# Patient Record
Sex: Female | Born: 1970
Health system: Southern US, Community
[De-identification: ages and names within clinical notes are randomized; demographics above are authoritative.]

---

## 2017-07-31 DIAGNOSIS — L299 Pruritus, unspecified: Secondary | ICD-10-CM | POA: Insufficient documentation

## 2018-04-14 ENCOUNTER — Ambulatory Visit: Payer: Commercial Managed Care - PPO | Admitting: Family Medicine

## 2018-04-14 ENCOUNTER — Encounter: Payer: Self-pay | Admitting: Family Medicine

## 2018-04-14 VITALS — BP 118/70 | HR 81 | Ht 65.0 in | Wt 130.4 lb

## 2018-04-14 DIAGNOSIS — M791 Myalgia, unspecified site: Secondary | ICD-10-CM | POA: Diagnosis not present

## 2018-04-14 DIAGNOSIS — B349 Viral infection, unspecified: Secondary | ICD-10-CM | POA: Diagnosis not present

## 2018-04-14 DIAGNOSIS — H9202 Otalgia, left ear: Secondary | ICD-10-CM | POA: Diagnosis not present

## 2018-04-14 LAB — CBC
HEMATOCRIT: 38.6 % (ref 36.0–46.0)
HEMOGLOBIN: 12.8 g/dL (ref 12.0–15.0)
MCHC: 33.1 g/dL (ref 30.0–36.0)
MCV: 90.3 fl (ref 78.0–100.0)
PLATELETS: 285 10*3/uL (ref 150.0–400.0)
RBC: 4.27 Mil/uL (ref 3.87–5.11)
RDW: 13.2 % (ref 11.5–15.5)
WBC: 8.9 10*3/uL (ref 4.0–10.5)

## 2018-04-14 LAB — POC INFLUENZA A&B (BINAX/QUICKVUE)
Influenza A, POC: NEGATIVE
Influenza B, POC: NEGATIVE

## 2018-04-14 NOTE — Patient Instructions (Addendum)
Viral Illness, Adult Viruses are tiny germs that can get into a person's body and cause illness. There are many different types of viruses, and they cause many types of illness. Viral illnesses can range from mild to severe. They can affect various parts of the body. Common illnesses that are caused by a virus include colds and the flu. Viral illnesses also include serious conditions such as HIV/AIDS (human immunodeficiency virus/acquired immunodeficiency syndrome). A few viruses have been linked to certain cancers. What are the causes? Many types of viruses can cause illness. Viruses invade cells in your body, multiply, and cause the infected cells to malfunction or die. When the cell dies, it releases more of the virus. When this happens, you develop symptoms of the illness, and the virus continues to spread to other cells. If the virus takes over the function of the cell, it can cause the cell to divide and grow out of control, as is the case when a virus causes cancer. Different viruses get into the body in different ways. You can get a virus by:  Swallowing food or water that is contaminated with the virus.  Breathing in droplets that have been coughed or sneezed into the air by an infected person.  Touching a surface that has been contaminated with the virus and then touching your eyes, nose, or mouth.  Being bitten by an insect or animal that carries the virus.  Having sexual contact with a person who is infected with the virus.  Being exposed to blood or fluids that contain the virus, either through an open cut or during a transfusion.  If a virus enters your body, your body's defense system (immune system) will try to fight the virus. You may be at higher risk for a viral illness if your immune system is weak. What are the signs or symptoms? Symptoms vary depending on the type of virus and the location of the cells that it invades. Common symptoms of the main types of viral illnesses  include: Cold and flu viruses  Fever.  Headache.  Sore throat.  Muscle aches.  Nasal congestion.  Cough. Digestive system (gastrointestinal) viruses  Fever.  Abdominal pain.  Nausea.  Diarrhea. Liver viruses (hepatitis)  Loss of appetite.  Tiredness.  Yellowing of the skin (jaundice). Brain and spinal cord viruses  Fever.  Headache.  Stiff neck.  Nausea and vomiting.  Confusion or sleepiness. Skin viruses  Warts.  Itching.  Rash. Sexually transmitted viruses  Discharge.  Swelling.  Redness.  Rash. How is this treated? Viruses can be difficult to treat because they live within cells. Antibiotic medicines do not treat viruses because these drugs do not get inside cells. Treatment for a viral illness may include:  Resting and drinking plenty of fluids.  Medicines to relieve symptoms. These can include over-the-counter medicine for pain and fever, medicines for cough or congestion, and medicines to relieve diarrhea.  Antiviral medicines. These drugs are available only for certain types of viruses. They may help reduce flu symptoms if taken early. There are also many antiviral medicines for hepatitis and HIV/AIDS.  Some viral illnesses can be prevented with vaccinations. A common example is the flu shot. Follow these instructions at home: Medicines   Take over-the-counter and prescription medicines only as told by your health care provider.  If you were prescribed an antiviral medicine, take it as told by your health care provider. Do not stop taking the medicine even if you start to feel better.  Be aware   of when antibiotics are needed and when they are not needed. Antibiotics do not treat viruses. If your health care provider thinks that you may have a bacterial infection as well as a viral infection, you may get an antibiotic. ? Do not ask for an antibiotic prescription if you have been diagnosed with a viral illness. That will not make your  illness go away faster. ? Frequently taking antibiotics when they are not needed can lead to antibiotic resistance. When this develops, the medicine no longer works against the bacteria that it normally fights. General instructions  Drink enough fluids to keep your urine clear or pale yellow.  Rest as much as possible.  Return to your normal activities as told by your health care provider. Ask your health care provider what activities are safe for you.  Keep all follow-up visits as told by your health care provider. This is important. How is this prevented? Take these actions to reduce your risk of viral infection:  Eat a healthy diet and get enough rest.  Wash your hands often with soap and water. This is especially important when you are in public places. If soap and water are not available, use hand sanitizer.  Avoid close contact with friends and family who have a viral illness.  If you travel to areas where viral gastrointestinal infection is common, avoid drinking water or eating raw food.  Keep your immunizations up to date. Get a flu shot every year as told by your health care provider.  Do not share toothbrushes, nail clippers, razors, or needles with other people.  Always practice safe sex.  Contact a health care provider if:  You have symptoms of a viral illness that do not go away.  Your symptoms come back after going away.  Your symptoms get worse. Get help right away if:  You have trouble breathing.  You have a severe headache or a stiff neck.  You have severe vomiting or abdominal pain. This information is not intended to replace advice given to you by your health care provider. Make sure you discuss any questions you have with your health care provider. Document Released: 04/12/2016 Document Revised: 05/15/2016 Document Reviewed: 04/12/2016 Elsevier Interactive Patient Education  2018 [place], Adult Ticks are insects that  draw blood for food. Most ticks live in shrubs and grassy areas. They climb onto people and animals that brush against the leaves and grasses that they rest on. Then they bite, attaching themselves to the skin. Most ticks are harmless, but some ticks carry germs that can spread to a person through a bite and cause a disease. To reduce your risk of getting a disease from a tick bite, it is important to take steps to prevent tick bites. It is also important to check for ticks after being outdoors. If you find that a tick has attached to you, watch for symptoms of disease. How can I prevent tick bites? Take these steps to help prevent tick bites when you are outdoors in an area where ticks are found:  Use insect repellent that has DEET (20% or higher), picaridin, or IR3535 in it. Use it on: ? Skin that is showing. ? The top of your boots. ? Your pant legs. ? Your sleeve cuffs.  For repellent products that contain permethrin, follow product instructions. Use these products on: ? Clothing. ? Gear. ? Boots. ? Tents.  Wear protective clothing. Long sleeves and long pants offer the best protection from ticks.  Wear light-colored clothing so you can see ticks more easily.  Tuck your pant legs into your socks.  If you go walking on a trail, stay in the middle of the trail so your skin, hair, and clothing do not touch the bushes.  Avoid walking through areas with long grass.  Check for ticks on your clothing, hair, and skin often while you are outside, and check again before you go inside. Make sure to check the places that ticks attach themselves most often. These places include the scalp, neck, armpits, waist, groin, and joint areas. Ticks that carry a disease called Lyme disease have to be attached to the skin for 24-48 hours. Checking for ticks every day will lessen your risk of this and other diseases.  When you come indoors, wash your clothes and take a shower or a bath right away. Dry your  clothes in a dryer on high heat for at least 60 minutes. This will kill any ticks in your clothes.  What is the proper way to remove a tick? If you find a tick on your body, remove it as soon as possible. Removing a tick sooner rather than later can prevent germs from passing from the tick to your body. To remove a tick that is crawling on your skin but has not bitten:  Go outdoors and brush the tick off.  Remove the tick with tape or a lint roller.  To remove a tick that is attached to your skin:  Wash your hands.  If you have latex gloves, put them on.  Use tweezers, curved forceps, or a tick-removal tool to gently grasp the tick as close to your skin and the tick's head as possible.  Gently pull with steady, upward pressure until the tick lets go. When removing the tick: ? Take care to keep the tick's head attached to its body. ? Do not twist or jerk the tick. This can make the tick's head or mouth break off. ? Do not squeeze or crush the tick's body. This could force disease-carrying fluids from the tick into your body.  Do not try to remove a tick with heat, alcohol, petroleum jelly, or fingernail polish. Using these methods can cause the tick to salivate and regurgitate into your bloodstream, increasing your risk of getting a disease. What should I do after removing a tick?  Clean the bite area with soap and water, rubbing alcohol, or an iodine scrub.  If an antiseptic cream or ointment is available, apply a small amount to the bite site.  Wash and disinfect any instruments that you used to remove the tick. How should I dispose of a tick? To dispose of a live tick, use one of these methods:  Place it in rubbing alcohol.  Place it in a sealed bag or container.  Wrap it tightly in tape.  Flush it down the toilet.  Contact a health care provider if:  You have symptoms of a disease after a tick bite. Symptoms of a tick-borne disease can occur from moments after the tick  bites to up to 30 days after a tick is removed. Symptoms include: ? Muscle, joint, or bone pain. ? Difficulty walking or moving your legs. ? Numbness in the legs. ? Paralysis. ? Red rash around the tick bite area that is shaped like a target or a "bull's-eye." ? Redness and swelling in the area of the tick bite. ? Fever. ? Repeated vomiting. ? Diarrhea. ? Weight loss. ? Tender, swollen lymph  glands. ? Shortness of breath. ? Cough. ? Pain in the abdomen. ? Headache. ? Abnormal tiredness. ? A change in your level of consciousness. ? Confusion. Get help right away if:  You are not able to remove a tick.  A part of a tick breaks off and gets stuck in your skin.  Your symptoms get worse. Summary  Ticks may carry germs that can spread to a person through a bite and cause disease.  Wear protective clothing and use insect repellent to prevent tick bites. Follow product instructions.  If you find a tick on your body, remove it as soon as possible. If the tick is attached, do not try to remove with heat, alcohol, petroleum jelly, or fingernail polish.  Remove the attached tick using tweezers, curved forceps, or a tick-removal tool. Gently pull with steady, upward pressure until the tick lets go. Do not twist or jerk the tick. Do not squeeze or crush the tick's body.  If you have symptoms after being bitten by a tick, contact a health care provider. This information is not intended to replace advice given to you by your health care provider. Make sure you discuss any questions you have with your health care provider. Document Released: 11/29/2000 Document Revised: 09/13/2016 Document Reviewed: 09/13/2016 Elsevier Interactive Patient Education  2018 [employer].  Viral Respiratory Infection A respiratory infection is an illness that affects part of the respiratory system, such as the lungs, nose, or throat. Most respiratory infections are caused by either viruses or bacteria. A  respiratory infection that is caused by a virus is called a viral respiratory infection. Common types of viral respiratory infections include:  A cold.  The flu (influenza).  A respiratory syncytial virus (RSV) infection.  How do I know if I have a viral respiratory infection? Most viral respiratory infections cause:  A stuffy or runny nose.  Yellow or green nasal discharge.  A cough.  Sneezing.  Fatigue.  Achy muscles.  A sore throat.  Sweating or chills.  A fever.  A headache.  How are viral respiratory infections treated? If influenza is diagnosed early, it may be treated with an antiviral medicine that shortens the length of time a person has symptoms. Symptoms of viral respiratory infections may be treated with over-the-counter and prescription medicines, such as:  Expectorants. These make it easier to cough up mucus.  Decongestant nasal sprays.  Health care providers do not prescribe antibiotic medicines for viral infections. This is because antibiotics are designed to kill bacteria. They have no effect on viruses. How do I know if I should stay home from work or school? To avoid exposing others to your respiratory infection, stay home if you have:  A fever.  A persistent cough.  A sore throat.  A runny nose.  Sneezing.  Muscles aches.  Headaches.  Fatigue.  Weakness.  Chills.  Sweating.  Nausea.  Follow these instructions at home:  Rest as much as possible.  Take over-the-counter and prescription medicines only as told by your health care provider.  Drink enough fluid to keep your urine clear or pale yellow. This helps prevent dehydration and helps loosen up mucus.  Gargle with a salt-water mixture 3-4 times per day or as needed. To make a salt-water mixture, completely dissolve -1 tsp of salt in 1 cup of warm water.  Use nose drops made from salt water to ease congestion and soften raw skin around your nose.  Do not drink  alcohol.  Do not use  tobacco products, including cigarettes, chewing tobacco, and e-cigarettes. If you need help quitting, ask your health care provider. Contact a health care provider if:  Your symptoms last for 10 days or longer.  Your symptoms get worse over time.  You have a fever.  You have severe sinus pain in your face or forehead.  The glands in your jaw or neck become very swollen. Get help right away if:  You feel pain or pressure in your chest.  You have shortness of breath.  You faint or feel like you will faint.  You have severe and persistent vomiting.  You feel confused or disoriented. This information is not intended to replace advice given to you by your health care provider. Make sure you discuss any questions you have with your health care provider. Document Released: 09/11/2005 Document Revised: 05/09/2016 Document Reviewed: 05/10/2015 Elsevier Interactive Patient Education  [employer].

## 2018-04-14 NOTE — Progress Notes (Signed)
Subjective:  Patient ID: Kayla Bowman, female    DOB: 1971/06/13  Age: 47 y.o. MRN: [id number]  CC: Establish Care   HPI Kayla Bowman presents for evaluation of a 3-day history of mildly elevated temperatures with myalgias, postnasal drip, sore throat and left greater than right ear discomfort.  Patient denies cough or wheezing.  She was seen at urgent care with a negative strep test.  She has had no facial pressure teeth pain and does not grind her teeth.  She is an avid runner and has not been running through the woods lately..  Patient has had a headache as well.  History Kayla Bowman has no past medical history on file.   She has no past surgical history on file.   Her family history is not on file.She reports that she has never smoked. She has never used smokeless tobacco. Her alcohol and drug histories are not on file.  Outpatient Medications Prior to Visit  Medication Sig Dispense Refill  . amoxicillin (AMOXIL) 500 MG capsule Take 500 mg by mouth 2 (two) times daily.     No facility-administered medications prior to visit.     ROS Review of Systems  Constitutional: Negative for chills, fatigue and fever.  HENT: Positive for congestion, ear pain, postnasal drip and sore throat. Negative for hearing loss, rhinorrhea and voice change.   Eyes: Negative.   Respiratory: Negative for cough and wheezing.   Cardiovascular: Negative.   Gastrointestinal: Negative.   Musculoskeletal: Positive for myalgias. Negative for arthralgias.  Skin: Negative for color change and rash.  Neurological: Positive for headaches. Negative for weakness.  Hematological: Does not bruise/bleed easily.  Psychiatric/Behavioral: Negative.     Objective:  BP 118/70   Pulse 81   Ht 5\' 5"  (1.651 m)   Wt 130 lb 6 oz (59.1 kg)   SpO2 98%   BMI 21.70 kg/m   Physical Exam  Constitutional: She is oriented to person, place, and time. She appears well-developed and well-nourished. No distress.  HENT:  Head:  Normocephalic and atraumatic.  Right Ear: External ear normal.  Left Ear: External ear normal.  Nose: Nose normal.  Mouth/Throat: Oropharynx is clear and moist. No oropharyngeal exudate.  Eyes: Pupils are equal, round, and reactive to light. Conjunctivae and EOM are normal. Right eye exhibits no discharge. Left eye exhibits no discharge. No scleral icterus.  Neck: Normal range of motion. No JVD present. No tracheal deviation present. No thyromegaly present.  Cardiovascular: Normal rate, regular rhythm and normal heart sounds.  Pulmonary/Chest: Effort normal. No stridor. No respiratory distress. She has no wheezes. She has no rales.  Abdominal: Bowel sounds are normal.  Neurological: She is alert and oriented to person, place, and time.  Skin: Skin is warm and dry. No rash noted. She is not diaphoretic. No erythema. No pallor.  Psychiatric: She has a normal mood and affect. Her behavior is normal.      Assessment & Plan:   Sreenidhi was seen today for establish care.  Diagnoses and all orders for this visit:  Viral syndrome -     POC Influenza A&B(BINAX/QUICKVUE) -     CBC  Ear discomfort, left  Muscle ache   I am having [name] maintain her amoxicillin.  No orders of the defined types were placed in this encounter.  Ordered prednisone low-dose to treat her stuffy ears.  Patient has not done well with prednisone in the past and her ears are bothering her that much.  Also brought up  the possibility of tic disease with her symptoms.  We will check a CBC to check the platelet level.  Follow-up: Return in about 1 week (around 04/21/2018), or if symptoms worsen or fail to improve.  Libby Maw, MD

## 2018-12-01 ENCOUNTER — Encounter (HOSPITAL_BASED_OUTPATIENT_CLINIC_OR_DEPARTMENT_OTHER): Payer: Self-pay | Admitting: *Deleted

## 2018-12-01 ENCOUNTER — Encounter (HOSPITAL_COMMUNITY)
Admission: EM | Disposition: A | Discharge status: Home / Self Care | Payer: Self-pay | Source: Home / Self Care | Attending: Emergency Medicine

## 2018-12-01 ENCOUNTER — Observation Stay (HOSPITAL_BASED_OUTPATIENT_CLINIC_OR_DEPARTMENT_OTHER)
Admission: EM | Admit: 2018-12-01 | Discharge: 2018-12-02 | Disposition: A | Discharge status: Home / Self Care | Payer: Commercial Managed Care - PPO | Attending: Surgery | Admitting: Surgery

## 2018-12-01 ENCOUNTER — Emergency Department (HOSPITAL_COMMUNITY): Payer: Commercial Managed Care - PPO | Admitting: Anesthesiology

## 2018-12-01 ENCOUNTER — Other Ambulatory Visit: Payer: Self-pay

## 2018-12-01 ENCOUNTER — Emergency Department (HOSPITAL_BASED_OUTPATIENT_CLINIC_OR_DEPARTMENT_OTHER): Payer: Commercial Managed Care - PPO

## 2018-12-01 ENCOUNTER — Encounter: Payer: Self-pay | Admitting: Family Medicine

## 2018-12-01 ENCOUNTER — Ambulatory Visit: Payer: Commercial Managed Care - PPO | Admitting: Family Medicine

## 2018-12-01 VITALS — BP 110/60 | HR 74 | Temp 99.0°F | Ht 65.0 in

## 2018-12-01 DIAGNOSIS — K769 Liver disease, unspecified: Secondary | ICD-10-CM | POA: Diagnosis not present

## 2018-12-01 DIAGNOSIS — Z791 Long term (current) use of non-steroidal anti-inflammatories (NSAID): Secondary | ICD-10-CM | POA: Diagnosis not present

## 2018-12-01 DIAGNOSIS — Z7982 Long term (current) use of aspirin: Secondary | ICD-10-CM | POA: Diagnosis not present

## 2018-12-01 DIAGNOSIS — R1110 Vomiting, unspecified: Secondary | ICD-10-CM | POA: Insufficient documentation

## 2018-12-01 DIAGNOSIS — R112 Nausea with vomiting, unspecified: Secondary | ICD-10-CM | POA: Diagnosis not present

## 2018-12-01 DIAGNOSIS — R1032 Left lower quadrant pain: Secondary | ICD-10-CM

## 2018-12-01 DIAGNOSIS — R1031 Right lower quadrant pain: Secondary | ICD-10-CM | POA: Diagnosis not present

## 2018-12-01 DIAGNOSIS — K3580 Unspecified acute appendicitis: Principal | ICD-10-CM | POA: Diagnosis present

## 2018-12-01 HISTORY — PX: LAPAROSCOPIC APPENDECTOMY: SHX408

## 2018-12-01 LAB — POCT URINALYSIS DIPSTICK
BILIRUBIN UA: NEGATIVE
Glucose, UA: NEGATIVE
KETONES UA: POSITIVE
Leukocytes, UA: NEGATIVE
Nitrite, UA: NEGATIVE
PH UA: 6 (ref 5.0–8.0)
Protein, UA: POSITIVE — AB
RBC UA: NEGATIVE
SPEC GRAV UA: 1.025 (ref 1.010–1.025)
UROBILINOGEN UA: 0.2 U/dL

## 2018-12-01 LAB — CBC WITH DIFFERENTIAL/PLATELET
Abs Immature Granulocytes: 0.07 10*3/uL (ref 0.00–0.07)
Basophils Absolute: 0 10*3/uL (ref 0.0–0.1)
Basophils Relative: 0 %
Eosinophils Absolute: 0 10*3/uL (ref 0.0–0.5)
Eosinophils Relative: 0 %
HCT: 42.2 % (ref 36.0–46.0)
Hemoglobin: 13.5 g/dL (ref 12.0–15.0)
Immature Granulocytes: 0 %
Lymphocytes Relative: 3 %
Lymphs Abs: 0.5 10*3/uL — ABNORMAL LOW (ref 0.7–4.0)
MCH: 29 pg (ref 26.0–34.0)
MCHC: 32 g/dL (ref 30.0–36.0)
MCV: 90.8 fL (ref 80.0–100.0)
Monocytes Absolute: 1 10*3/uL (ref 0.1–1.0)
Monocytes Relative: 6 %
NEUTROS ABS: 16.5 10*3/uL — AB (ref 1.7–7.7)
Neutrophils Relative %: 91 %
Platelets: 286 10*3/uL (ref 150–400)
RBC: 4.65 MIL/uL (ref 3.87–5.11)
RDW: 13.3 % (ref 11.5–15.5)
WBC: 18.1 10*3/uL — ABNORMAL HIGH (ref 4.0–10.5)
nRBC: 0 % (ref 0.0–0.2)

## 2018-12-01 LAB — LIPASE, BLOOD: Lipase: 30 U/L (ref 11–51)

## 2018-12-01 LAB — COMPREHENSIVE METABOLIC PANEL
ALT: 15 U/L (ref 0–44)
AST: 18 U/L (ref 15–41)
Albumin: 4.4 g/dL (ref 3.5–5.0)
Alkaline Phosphatase: 30 U/L — ABNORMAL LOW (ref 38–126)
Anion gap: 8 (ref 5–15)
BUN: 11 mg/dL (ref 6–20)
CO2: 23 mmol/L (ref 22–32)
Calcium: 8.9 mg/dL (ref 8.9–10.3)
Chloride: 108 mmol/L (ref 98–111)
Creatinine, Ser: 0.65 mg/dL (ref 0.44–1.00)
GFR calc non Af Amer: >60 mL/min (ref >60)
Glucose, Bld: 109 mg/dL — ABNORMAL HIGH (ref 70–99)
Potassium: 4.1 mmol/L (ref 3.5–5.1)
Sodium: 139 mmol/L (ref 135–145)
Total Bilirubin: 1.1 mg/dL (ref 0.3–1.2)
Total Protein: 6.8 g/dL (ref 6.5–8.1)

## 2018-12-01 LAB — HCG, SERUM, QUALITATIVE: PREG SERUM: NEGATIVE

## 2018-12-01 LAB — POCT URINE PREGNANCY: PREG TEST UR: NEGATIVE

## 2018-12-01 SURGERY — APPENDECTOMY, LAPAROSCOPIC
Anesthesia: General | Site: Abdomen

## 2018-12-01 MED ORDER — MEPERIDINE HCL 50 MG/ML IJ SOLN: 6.2500 mg | INTRAMUSCULAR | Status: DC | PRN

## 2018-12-01 MED ORDER — BUPIVACAINE-EPINEPHRINE 0.25% -1:200000 IJ SOLN
INTRAMUSCULAR | Status: DC | PRN
  Administered 2018-12-01: 30 mL

## 2018-12-01 MED ORDER — MIDAZOLAM HCL 5 MG/5ML IJ SOLN
INTRAMUSCULAR | Status: DC | PRN
  Administered 2018-12-01: 2 mg via INTRAVENOUS

## 2018-12-01 MED ORDER — SUGAMMADEX SODIUM 200 MG/2ML IV SOLN
INTRAVENOUS | Status: AC
  Filled 2018-12-01: qty 2

## 2018-12-01 MED ORDER — SODIUM CHLORIDE 0.9 % IV SOLN
2.0000 g | Freq: Once | INTRAVENOUS | Status: AC
  Administered 2018-12-01: 2 g via INTRAVENOUS
  Filled 2018-12-01: qty 20

## 2018-12-01 MED ORDER — METOCLOPRAMIDE HCL 5 MG/ML IJ SOLN: 10.0000 mg | Freq: Once | INTRAMUSCULAR | Status: DC | PRN

## 2018-12-01 MED ORDER — LIDOCAINE 2% (20 MG/ML) 5 ML SYRINGE
INTRAMUSCULAR | Status: DC | PRN
  Administered 2018-12-01: 80 mg via INTRAVENOUS

## 2018-12-01 MED ORDER — SUCCINYLCHOLINE CHLORIDE 200 MG/10ML IV SOSY
PREFILLED_SYRINGE | INTRAVENOUS | Status: DC | PRN
  Administered 2018-12-01: 100 mg via INTRAVENOUS

## 2018-12-01 MED ORDER — SUCCINYLCHOLINE CHLORIDE 200 MG/10ML IV SOSY
PREFILLED_SYRINGE | INTRAVENOUS | Status: AC
  Filled 2018-12-01: qty 10

## 2018-12-01 MED ORDER — IOPAMIDOL (ISOVUE-300) INJECTION 61%
100.0000 mL | Freq: Once | INTRAVENOUS | Status: AC | PRN
  Administered 2018-12-01: 100 mL via INTRAVENOUS

## 2018-12-01 MED ORDER — BUPIVACAINE-EPINEPHRINE (PF) 0.25% -1:200000 IJ SOLN
INTRAMUSCULAR | Status: AC
  Filled 2018-12-01: qty 30

## 2018-12-01 MED ORDER — ONDANSETRON HCL 4 MG/2ML IJ SOLN
INTRAMUSCULAR | Status: DC | PRN
  Administered 2018-12-01: 4 mg via INTRAVENOUS

## 2018-12-01 MED ORDER — SUGAMMADEX SODIUM 200 MG/2ML IV SOLN
INTRAVENOUS | Status: DC | PRN
  Administered 2018-12-01: 125 mg via INTRAVENOUS

## 2018-12-01 MED ORDER — METRONIDAZOLE IN NACL 5-0.79 MG/ML-% IV SOLN
500.0000 mg | Freq: Once | INTRAVENOUS | Status: DC
  Filled 2018-12-01: qty 100

## 2018-12-01 MED ORDER — LACTATED RINGERS IV SOLN
INTRAVENOUS | Status: DC
  Administered 2018-12-01 (×2): via INTRAVENOUS

## 2018-12-01 MED ORDER — FENTANYL CITRATE (PF) 100 MCG/2ML IJ SOLN
INTRAMUSCULAR | Status: DC | PRN
  Administered 2018-12-01 (×2): 100 ug via INTRAVENOUS
  Administered 2018-12-01: 50 ug via INTRAVENOUS

## 2018-12-01 MED ORDER — 0.9 % SODIUM CHLORIDE (POUR BTL) OPTIME
TOPICAL | Status: DC | PRN
  Administered 2018-12-01: 1000 mL

## 2018-12-01 MED ORDER — MIDAZOLAM HCL 2 MG/2ML IJ SOLN
INTRAMUSCULAR | Status: AC
  Filled 2018-12-01: qty 2

## 2018-12-01 MED ORDER — PROMETHAZINE HCL 25 MG/ML IJ SOLN
25.0000 mg | Freq: Once | INTRAMUSCULAR | Status: AC
  Administered 2018-12-01: 25 mg via INTRAMUSCULAR

## 2018-12-01 MED ORDER — FENTANYL CITRATE (PF) 250 MCG/5ML IJ SOLN
INTRAMUSCULAR | Status: AC
  Filled 2018-12-01: qty 5

## 2018-12-01 MED ORDER — LIDOCAINE 2% (20 MG/ML) 5 ML SYRINGE
INTRAMUSCULAR | Status: AC
  Filled 2018-12-01: qty 5

## 2018-12-01 MED ORDER — ROCURONIUM BROMIDE 10 MG/ML (PF) SYRINGE
PREFILLED_SYRINGE | INTRAVENOUS | Status: AC
  Filled 2018-12-01: qty 10

## 2018-12-01 MED ORDER — FENTANYL CITRATE (PF) 100 MCG/2ML IJ SOLN: 25.0000 ug | INTRAMUSCULAR | Status: DC | PRN

## 2018-12-01 MED ORDER — LACTATED RINGERS IR SOLN
Status: DC | PRN
  Administered 2018-12-01: 1000 mL

## 2018-12-01 MED ORDER — PROPOFOL 10 MG/ML IV BOLUS
INTRAVENOUS | Status: AC
  Filled 2018-12-01: qty 20

## 2018-12-01 MED ORDER — DEXAMETHASONE SODIUM PHOSPHATE 10 MG/ML IJ SOLN
INTRAMUSCULAR | Status: DC | PRN
  Administered 2018-12-01: 10 mg via INTRAVENOUS

## 2018-12-01 MED ORDER — PROPOFOL 10 MG/ML IV BOLUS
INTRAVENOUS | Status: DC | PRN
  Administered 2018-12-01: 160 mg via INTRAVENOUS

## 2018-12-01 MED ORDER — ROCURONIUM BROMIDE 10 MG/ML (PF) SYRINGE
PREFILLED_SYRINGE | INTRAVENOUS | Status: DC | PRN
  Administered 2018-12-01: 30 mg via INTRAVENOUS

## 2018-12-01 SURGICAL SUPPLY — 34 items
APPLIER CLIP ROT 10 11.4 M/L (STAPLE)
BENZOIN TINCTURE PRP APPL 2/3 (GAUZE/BANDAGES/DRESSINGS) IMPLANT
CABLE HIGH FREQUENCY MONO STRZ (ELECTRODE) ×3 IMPLANT
CHLORAPREP W/TINT 26ML (MISCELLANEOUS) ×3 IMPLANT
CLIP APPLIE ROT 10 11.4 M/L (STAPLE) IMPLANT
CLOSURE WOUND 1/2 X4 (GAUZE/BANDAGES/DRESSINGS)
COVER SURGICAL LIGHT HANDLE (MISCELLANEOUS) ×3 IMPLANT
COVER WAND RF STERILE (DRAPES) IMPLANT
CUTTER FLEX LINEAR 45M (STAPLE) ×3 IMPLANT
DECANTER SPIKE VIAL GLASS SM (MISCELLANEOUS) ×3 IMPLANT
DERMABOND ADVANCED (GAUZE/BANDAGES/DRESSINGS) ×2
DERMABOND ADVANCED .7 DNX12 (GAUZE/BANDAGES/DRESSINGS) ×1 IMPLANT
DRAPE LAPAROSCOPIC ABDOMINAL (DRAPES) ×3 IMPLANT
ELECT REM PT RETURN 15FT ADLT (MISCELLANEOUS) ×3 IMPLANT
ENDOLOOP SUT PDS II  0 18 (SUTURE)
ENDOLOOP SUT PDS II 0 18 (SUTURE) IMPLANT
GLOVE SURG SIGNA 7.5 PF LTX (GLOVE) ×3 IMPLANT
GOWN STRL REUS W/TWL XL LVL3 (GOWN DISPOSABLE) ×6 IMPLANT
KIT BASIN OR (CUSTOM PROCEDURE TRAY) ×3 IMPLANT
POUCH SPECIMEN RETRIEVAL 10MM (ENDOMECHANICALS) IMPLANT
RELOAD 45 VASCULAR/THIN (ENDOMECHANICALS) IMPLANT
RELOAD STAPLE TA45 3.5 REG BLU (ENDOMECHANICALS) ×3 IMPLANT
SCISSORS LAP 5X35 DISP (ENDOMECHANICALS) ×3 IMPLANT
SET IRRIG TUBING LAPAROSCOPIC (IRRIGATION / IRRIGATOR) ×3 IMPLANT
SHEARS HARMONIC ACE PLUS 36CM (ENDOMECHANICALS) ×3 IMPLANT
SLEEVE XCEL OPT CAN 5 100 (ENDOMECHANICALS) ×3 IMPLANT
STRIP CLOSURE SKIN 1/2X4 (GAUZE/BANDAGES/DRESSINGS) IMPLANT
SUT MNCRL AB 4-0 PS2 18 (SUTURE) ×3 IMPLANT
SUT VIC AB 2-0 SH 18 (SUTURE) IMPLANT
TOWEL OR 17X26 10 PK STRL BLUE (TOWEL DISPOSABLE) ×3 IMPLANT
TOWEL OR NON WOVEN STRL DISP B (DISPOSABLE) ×3 IMPLANT
TRAY LAPAROSCOPIC (CUSTOM PROCEDURE TRAY) ×3 IMPLANT
TROCAR BLADELESS OPT 5 100 (ENDOMECHANICALS) ×3 IMPLANT
TROCAR XCEL BLUNT TIP 100MML (ENDOMECHANICALS) ×3 IMPLANT

## 2018-12-01 NOTE — ED Triage Notes (Signed)
Abdominal pain since this am. Vomiting after lunch. She was seen by her MD and given an antinausea injection.

## 2018-12-01 NOTE — ED Notes (Signed)
Will have CT around 1930

## 2018-12-01 NOTE — Anesthesia Procedure Notes (Signed)
Procedure Name: Intubation Date/Time: 12/01/2018 10:33 PM Performed by: Markia Kyer, Clinical cytogeneticist D, CRNA Pre-anesthesia Checklist: Patient identified, Emergency Drugs available, Suction available and Patient being monitored Patient Re-evaluated:Patient Re-evaluated prior to induction Oxygen Delivery Method: Circle system utilized Preoxygenation: Pre-oxygenation with 100% oxygen Induction Type: IV induction, Rapid sequence and Cricoid Pressure applied Laryngoscope Size: Mac and 4 Grade View: Grade I Tube type: Oral Number of attempts: 1 Airway Equipment and Method: Stylet and Oral airway Placement Confirmation: ETT inserted through vocal cords under direct vision,  positive ETCO2 and breath sounds checked- equal and bilateral Secured at: 21 cm Tube secured with: Tape Dental Injury: Teeth and Oropharynx as per pre-operative assessment

## 2018-12-01 NOTE — Transfer of Care (Signed)
Immediate Anesthesia Transfer of Care Note  Patient: Kayla Bowman  Procedure(s) Performed: APPENDECTOMY LAPAROSCOPIC (N/A Abdomen)  Patient Location: PACU  Anesthesia Type:General  Level of Consciousness: awake, alert  and oriented  Airway & Oxygen Therapy: Patient Spontanous Breathing and Patient connected to face mask oxygen  Post-op Assessment: Report given to RN and Post -op Vital signs reviewed and stable  Post vital signs: Reviewed and stable  Last Vitals:  Vitals Value Taken Time  BP    Temp    Pulse 84 12/01/2018 11:33 PM  Resp 12 12/01/2018 11:33 PM  SpO2 100 % 12/01/2018 11:33 PM  Vitals shown include unvalidated device data.  Last Pain:  Vitals:   12/01/18 2042  TempSrc: Oral  PainSc:          Complications: No apparent anesthesia complications

## 2018-12-01 NOTE — Anesthesia Preprocedure Evaluation (Signed)
Anesthesia Evaluation  Patient identified by MRN, date of birth, ID band Patient awake    Reviewed: Allergy & Precautions, NPO status , Patient's Chart, lab work & pertinent test results  Airway Mallampati: II  TM Distance: >3 FB Neck ROM: Full    Dental no notable dental hx.    Pulmonary neg pulmonary ROS,    Pulmonary exam normal breath sounds clear to auscultation       Cardiovascular negative cardio ROS Normal cardiovascular exam Rhythm:Regular Rate:Normal     Neuro/Psych negative neurological ROS  negative psych ROS   GI/Hepatic negative GI ROS, Neg liver ROS,   Endo/Other  negative endocrine ROS  Renal/GU negative Renal ROS  negative genitourinary   Musculoskeletal negative musculoskeletal ROS (+)   Abdominal   Peds negative pediatric ROS (+)  Hematology negative hematology ROS (+)   Anesthesia Other Findings   Reproductive/Obstetrics negative OB ROS                             Anesthesia Physical Anesthesia Plan  ASA: I and emergent  Anesthesia Plan: General   Post-op Pain Management:    Induction: Intravenous, Rapid sequence and Cricoid pressure planned  PONV Risk Score and Plan: 4 or greater and Ondansetron, Dexamethasone, Midazolam and Scopolamine patch - Pre-op  Airway Management Planned: Oral ETT  Additional Equipment:   Intra-op Plan:   Post-operative Plan: Extubation in OR  Informed Consent: I have reviewed the patients History and Physical, chart, labs and discussed the procedure including the risks, benefits and alternatives for the proposed anesthesia with the patient or authorized representative who has indicated his/her understanding and acceptance.   Dental advisory given  Plan Discussed with: CRNA  Anesthesia Plan Comments:         Anesthesia Quick Evaluation

## 2018-12-01 NOTE — Op Note (Signed)
Re:   Kayla Bowman DOB:   03-21-1971 MRN:   [id number]                   FACILITY:  [facility]  DATE OF PROCEDURE: 12/01/2018                              OPERATIVE REPORT  PREOPERATIVE DIAGNOSIS:  Appendicitis  POSTOPERATIVE DIAGNOSIS:  Acute purulent appendicitis.  PROCEDURE:  Laparoscopic appendectomy.  SURGEON:  Fenton Malling. Lucia Gaskins, MD  ASSISTANT:  No first assistant.  ANESTHESIA:  General endotracheal.  Anesthesiologist: Montez Hageman, MD CRNA: Noralyn Pick D, CRNA  ASA:  2E  ESTIMATED BLOOD LOSS:  Minimal.  DRAINS: none   SPECIMEN:   Appendix  COUNTS CORRECT:  YES  INDICATIONS FOR PROCEDURE: Kayla Bowman is a 47 y.o. (DOB: 04-Mar-1971)  female whose primary care doctor is Libby Maw, MD and comes to the OR for an appendectomy.   I discussed with the patient, the indications and potential complications of appendiceal surgery.  The potential complications include, but are not limited to, bleeding, open surgery, bowel resection, and the possibility of another diagnosis.  OPERATIVE NOTE:  The patient underwent a general endotracheal anesthetic as supervised by Anesthesiologist: Montez Hageman, MD CRNA: Marijo Conception, CRNA, General, in [place] room #1.  The patient was given Rocephin and Flagyl prior to the beginning of the procedure and the abdomen was prepped with ChloraPrep.   A time-out was held and surgical checklist run.  An infraumbilical incision was made with sharp dissection carried down to the abdominal cavity.  I used her old transverse umbilical scar.   An 12 mm Hasson trocar was inserted through the infraumbilical incision and into the peritoneal cavity.  A 30 degree 5 mm laparoscope was inserted through a 12 mm Hasson trocar and the Hasson trocar secured with a 0 Vicryl suture.  I placed a 5 mm trocar in the right upper quadrant and a 5 mm torcar in left lower quadrant and did abdominal exploration.    The right and left lobes of liver unremarkable.   She does have at least one benign cyst (about 3 cm) at the edge of the right lobe of the liver.  Stomach was unremarkable.  The pelvic organs were unremarkable, though there was scar tissue that limited the visibility of the ovaries.  I saw no other intra-abdominal abnormality.  The patient had appendicitis with the appendix located at the right pelvic brim.  It was purulent.  There was also some scarring around the appendix I suspect from her prior operations. The appendix was not ruptured.  The mesentery of the appendix was divided with a Harmonic scalpel.  I got to the base of the appendix.  I then used a blue load 45 mm Ethicon Endo-GIA stapler and fired this across the base of the appendix.  I placed the appendix in EndoCatch bag and delivered the bag through the umbilical incision.  I irrigated the abdomen with 600 cc of saline.  After irrigating the abdomen, I then removed the trocars, in turn.  The umbilical port fascia was closed with 0 Vicryl suture.   I closed the skin each site with a 4-0 Monocryl suture and painted the wounds with DermaBond.  I then injected a total of 30 mL of 0.25% Marcaine at the incisions.  Sponge and needle count were correct at the end of the case.  The patient was  transferred to the recovery room in good condition.  The patient tolerated the procedure well and it depends on the patient's post op clinical course as to when the patient could be discharged.   Alphonsa Overall, MD, [facility] Surgery Pager: [phone] Office phone:  [phone]

## 2018-12-01 NOTE — ED Notes (Signed)
Carelink notified Nunzio Cobbs) -  Patient ready to transport to PACU @ [facility]

## 2018-12-01 NOTE — ED Notes (Signed)
Report to carelink. ETA 20 minutes 

## 2018-12-01 NOTE — Progress Notes (Signed)
Established Patient Office Visit  Subjective:  Patient ID: Kayla Bowman, female    DOB: 1971/06/25  Age: 47 y.o. MRN: [id number]  CC:  Chief Complaint  Patient presents with  . Emesis  . Diarrhea    HPI Kayla Bowman presents for treatment and evaluation of a 1 day history of nausea and vomiting x8 associated with upper and lower abdominal pain.  Vomitus without bilious fluid, coffee grinds or frank blood.  There has been an elevated temperature.  Stool was normal this morning.  There is been no diarrhea.  She does have a history of ectopic pregnancy on the right with subsequent salpingo-ectomy.  She is midcycle.  There is been no dysuria frequency urgency or unusual vaginal discharge.  History reviewed. No pertinent past medical history.  History reviewed. No pertinent surgical history.  History reviewed. No pertinent family history.  Social History   Socioeconomic History  . Marital status: Married    Spouse name: Not on file  . Number of children: Not on file  . Years of education: Not on file  . Highest education level: Not on file  Occupational History  . Not on file  Social Needs  . Financial resource strain: Not on file  . Food insecurity:    Worry: Not on file    Inability: Not on file  . Transportation needs:    Medical: Not on file    Non-medical: Not on file  Tobacco Use  . Smoking status: Never Smoker  . Smokeless tobacco: Never Used  Substance and Sexual Activity  . Alcohol use: Not on file  . Drug use: Not on file  . Sexual activity: Not on file  Lifestyle  . Physical activity:    Days per week: Not on file    Minutes per session: Not on file  . Stress: Not on file  Relationships  . Social connections:    Talks on phone: Not on file    Gets together: Not on file    Attends religious service: Not on file    Active member of club or organization: Not on file    Attends meetings of clubs or organizations: Not on file    Relationship status: Not  on file  . Intimate partner violence:    Fear of current or ex partner: Not on file    Emotionally abused: Not on file    Physically abused: Not on file    Forced sexual activity: Not on file  Other Topics Concern  . Not on file  Social History Narrative  . Not on file    Outpatient Medications Prior to Visit  Medication Sig Dispense Refill  . amoxicillin (AMOXIL) 500 MG capsule Take 500 mg by mouth 2 (two) times daily.     No facility-administered medications prior to visit.     Not on File  ROS Review of Systems  Constitutional: Negative for chills, diaphoresis, fatigue, fever and unexpected weight change.  HENT: Negative.   Eyes: Negative for photophobia and visual disturbance.  Respiratory: Negative.   Cardiovascular: Negative.   Gastrointestinal: Positive for abdominal pain, nausea and vomiting. Negative for anal bleeding, blood in stool and constipation.  Endocrine: Negative for polyphagia and polyuria.  Genitourinary: Negative for dysuria, flank pain, frequency, urgency, vaginal bleeding and vaginal discharge.  Musculoskeletal: Negative for arthralgias and myalgias.  Skin: Negative for pallor and rash.  Allergic/Immunologic: Negative for immunocompromised state.  Neurological: Negative for light-headedness and headaches.  Hematological: Does not bruise/bleed easily.  Psychiatric/Behavioral: Negative.       Objective:    Physical Exam  Constitutional: She is oriented to person, place, and time. She appears well-developed and well-nourished. No distress.  HENT:  Head: Normocephalic and atraumatic.  Right Ear: External ear normal.  Left Ear: External ear normal.  Mouth/Throat: Oropharynx is clear and moist. No oropharyngeal exudate.  Eyes: Pupils are equal, round, and reactive to light. Conjunctivae are normal. Right eye exhibits no discharge. Left eye exhibits no discharge. No scleral icterus.  Neck: Neck supple. No JVD present. No tracheal deviation present. No  thyromegaly present.  Cardiovascular: Normal rate, regular rhythm and normal heart sounds.  Pulmonary/Chest: Effort normal and breath sounds normal. No stridor.  Abdominal: Soft. Bowel sounds are normal. She exhibits no distension. There is no hepatosplenomegaly, splenomegaly or hepatomegaly. There is abdominal tenderness in the right lower quadrant. There is rebound. There is no guarding and no CVA tenderness.  Lymphadenopathy:    She has no cervical adenopathy.  Neurological: She is alert and oriented to person, place, and time.  Skin: Skin is warm and dry. She is not diaphoretic.  Psychiatric: She has a normal mood and affect. Her behavior is normal.    BP 110/60   Pulse 74   Temp 99 F (37.2 C) (Oral)   Ht '5\' 5"'  (1.651 m)   SpO2 99%   BMI 21.70 kg/m  Wt Readings from Last 3 Encounters:  04/14/18 130 lb 6 oz (59.1 kg)   BP Readings from Last 3 Encounters:  12/01/18 110/60  04/14/18 118/70   Guideline developer:  UpToDate (see UpToDate for funding source) Date Released: June 2014  Health Maintenance Due  Topic Date Due  . HIV Screening  09/16/1986  . TETANUS/TDAP  09/16/1990  . PAP SMEAR-Modifier  09/16/1992  . INFLUENZA VACCINE  07/16/2018    There are no preventive care reminders to display for this patient.  No results found for: TSH Lab Results  Component Value Date   WBC 8.9 04/14/2018   HGB 12.8 04/14/2018   HCT 38.6 04/14/2018   MCV 90.3 04/14/2018   PLT 285.0 04/14/2018   No results found for: NA, K, CHLORIDE, CO2, GLUCOSE, BUN, CREATININE, BILITOT, ALKPHOS, AST, ALT, PROT, ALBUMIN, CALCIUM, ANIONGAP, EGFR, GFR No results found for: CHOL No results found for: HDL No results found for: LDLCALC No results found for: TRIG No results found for: CHOLHDL No results found for: HGBA1C    Assessment & Plan:   Problem List Items Addressed This Visit    None    Visit Diagnoses    Right lower quadrant abdominal pain    -  Primary   Relevant Orders   POCT  urine pregnancy (Completed)   POCT Urinalysis Dipstick (Completed)   CBC   Comprehensive metabolic panel   Amylase   Left lower quadrant abdominal pain       Relevant Orders   CBC   Comprehensive metabolic panel   Non-intractable vomiting with nausea, unspecified vomiting type       Relevant Medications   promethazine (PHENERGAN) injection 25 mg (Completed)   Other Relevant Orders   CBC   Comprehensive metabolic panel   Amylase      Meds ordered this encounter  Medications  . promethazine (PHENERGAN) injection 25 mg    Follow-up: No follow-ups on file.   Urinalysis showed dehydration urine pregnancy was negative.  Blood work is pending.  Patient with rebound tenderness.  And concerned about appendicitis.  Patient will proceed  to med center in [facility] for consideration of CT scan to rule out appendicitis.

## 2018-12-01 NOTE — H&P (Signed)
Re:   Kayla Bowman DOB:   03-27-1971 MRN:   [id number]  Chief Complaint Abdominal pain  ASSESEMENT AND PLAN: 1.  Appendicitis  I discussed with the patient the indications and risks of appendiceal surgery.  The primary risks of appendiceal surgery include, but are not limited to, bleeding, infection, bowel surgery, and open surgery.  There is also the risk that the patient may have continued symptoms after surgery.  We discussed the typical post-operative recovery course. I tried to answer the patient's questions.  2.  History of prior ectopic pregnancy and multiple laparoscopies related to attempted pregnancies.  Chief Complaint  Patient presents with  . Abdominal Pain  . Fever   PHYSICIAN REQUESTING CONSULTATION:  York Cerise, PA at [facility] ILLNESS: Kayla Bowman is a 47 y.o. (DOB: 1971-08-14)  female whose primary care physician is Libby Maw, MD.   She was transferred from [facility] to the PACU at [facility].    She developed pain in her lower abdomen around 9 AM today.  She had a similar episode in Oct 2019 that lasted about one day, but did not seek medical help.  This time she went to her PCP, Dr. Ethelene Hal, who referred her to the [facility] for a CT scan.  She has no chronic GI issues.  She had a coloncoscopy in [place] in 2017.  She has had an ectopic pregnancy and multiple laparoscopies for attempted pregnancies.  She is otherwise very healthy.  [place] did a CT scan which suggests appendicitis.  Her CT scan on 12/01/2018 - acute appendicitis. WBC - 18,100 - 12/01/2018   History reviewed. No pertinent past medical history.   History reviewed. No pertinent surgical history.    Current Facility-Administered Medications  Medication Dose Route Frequency Provider Last Rate Last Dose  . cefTRIAXone (ROCEPHIN) 2 g in sodium chloride 0.9 % 100 mL IVPB  2 g Intravenous Once Caccavale, Sophia, PA-C 200 mL/hr  at 12/01/18 2038 2 g at 12/01/18 2038   And  . metroNIDAZOLE (FLAGYL) IVPB 500 mg  500 mg Intravenous Once Caccavale, Sophia, PA-C       No current outpatient medications on file.     No Known Allergies  REVIEW OF SYSTEMS: Skin:  No history of rash.  No history of abnormal moles. Infection:  No history of hepatitis or HIV.  No history of MRSA. Neurologic:  No history of stroke.  No history of seizure.  No history of headaches. Cardiac:  No history of hypertension. No history of heart disease.  No history of seeing a cardiologist. Pulmonary:  Does not smoke cigarettes.  No asthma or bronchitis.  No OSA/CPAP.  Endocrine:  No diabetes. No thyroid disease. Gastrointestinal:  See HPI GYN: Multiple laparoscopies and one ectopic pregnancy Urologic:  No history of kidney stones.  No history of bladder infections. Musculoskeletal:  No history of joint or back disease. Hematologic:  No bleeding disorder.  No history of anemia.  Not anticoagulated. Psycho-social:  The patient is oriented.   The patient has no obvious psychologic or social impairment to understanding our conversation and plan.  SOCIAL and FAMILY HISTORY: Married.  Her husband, Kayla Bowman is in [place].  His phone:  [phone]. Her mother is Kayla Bowman.  Lives in [place]. Her phone is [phone]. She has no children.  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  PHYSICAL EXAM: BP (!) 115/59 (BP Location: Right Arm)  Pulse 78   Temp 99 F (37.2 C) (Oral)   Resp 14   Ht 5\' 5"  (1.651 m)   Wt 54.9 kg   LMP 11/13/2018   SpO2 100%   BMI 20.14 kg/m   General: [place] F who is alert and generally healthy appearing.  She said that she feels better after some meds. Skin:  Inspection and palpation - no mass or rash. Eyes:  Conjunctiva and lids unremarkable.            Pupils are equal Ears, Nose, Mouth, and Throat:  Ears and nose unremarkable            Lips and teeth are unremarable. Neck: Supple. No mass,  trachea midline.  No thyroid mass. Lymph Nodes:  No supraclavicular, cervical, or inguinal nodes. Lungs: Normal respiratory effort.  Clear to auscultation and symmetric breath sounds. Heart:  Palpation of the heart is normal.            Auscultation: RRR. No murmur or rub.  She said that she has a murmur, but I did not hear one.  Abdomen: Soft. No mass. Tender with mild guarding in the RLQ.  She has a pfannenstiel scar.            Rectal: Not done. Musculoskeletal:  Good muscle strength and ROM  in upper and lower extremities.  Neurologic:  Grossly intact to motor and sensory function. Psychiatric: Normal judgement and insight. Behavior is normal.            Oriented to time, person, place.   DATA REVIEWED, COUNSELING AND COORDINATION OF CARE: Epic notes reviewed. Counseling and coordination of care exceeded more than 50% of the time spent with patient. Total time spent with patient and charting: 45 minutes  Alphonsa Overall, MD,  [facility] Surgery, [place]  [place], [place]    [place] Phone:  [phone] FAX:  [phone]

## 2018-12-02 ENCOUNTER — Encounter (HOSPITAL_COMMUNITY): Payer: Self-pay | Admitting: Surgery

## 2018-12-02 LAB — COMPREHENSIVE METABOLIC PANEL
ALT: 12 U/L (ref 0–35)
AST: 17 U/L (ref 0–37)
Albumin: 4.4 g/dL (ref 3.5–5.2)
Alkaline Phosphatase: 29 U/L — ABNORMAL LOW (ref 39–117)
BUN: 11 mg/dL (ref 6–23)
CO2: 25 meq/L (ref 19–32)
Calcium: 9 mg/dL (ref 8.4–10.5)
Chloride: 106 mEq/L (ref 96–112)
Creatinine, Ser: 0.68 mg/dL (ref 0.40–1.20)
GFR: 98.49 mL/min (ref >60.00)
GLUCOSE: 114 mg/dL — AB (ref 70–99)
Potassium: 4.2 mEq/L (ref 3.5–5.1)
SODIUM: 139 meq/L (ref 135–145)
Total Bilirubin: 0.8 mg/dL (ref 0.2–1.2)
Total Protein: 6.8 g/dL (ref 6.0–8.3)

## 2018-12-02 LAB — CBC
HEMATOCRIT: 41.6 % (ref 36.0–46.0)
Hemoglobin: 13.5 g/dL (ref 12.0–15.0)
MCHC: 32.5 g/dL (ref 30.0–36.0)
MCV: 89.4 fl (ref 78.0–100.0)
Platelets: 295 10*3/uL (ref 150.0–400.0)
RBC: 4.65 Mil/uL (ref 3.87–5.11)
RDW: 13.5 % (ref 11.5–15.5)
WBC: 17.8 10*3/uL — AB (ref 4.0–10.5)

## 2018-12-02 LAB — AMYLASE: Amylase: 34 U/L (ref 27–131)

## 2018-12-02 MED ORDER — IBUPROFEN 200 MG PO TABS: 400.0000 mg | ORAL_TABLET | Freq: Four times a day (QID) | ORAL | Status: AC | PRN

## 2018-12-02 MED ORDER — TRAMADOL HCL 50 MG PO TABS: 50.0000 mg | ORAL_TABLET | Freq: Four times a day (QID) | ORAL | Status: DC | PRN

## 2018-12-02 MED ORDER — ACETAMINOPHEN 325 MG PO TABS: 650.0000 mg | ORAL_TABLET | Freq: Four times a day (QID) | ORAL | Status: AC | PRN

## 2018-12-02 MED ORDER — ONDANSETRON HCL 4 MG/2ML IJ SOLN: 4.0000 mg | Freq: Four times a day (QID) | INTRAMUSCULAR | Status: DC | PRN

## 2018-12-02 MED ORDER — SODIUM CHLORIDE 0.9 % IV SOLN
2.0000 g | INTRAVENOUS | Status: DC
  Filled 2018-12-02: qty 20

## 2018-12-02 MED ORDER — MORPHINE SULFATE (PF) 2 MG/ML IV SOLN: 1.0000 mg | INTRAVENOUS | Status: DC | PRN

## 2018-12-02 MED ORDER — ONDANSETRON 4 MG PO TBDP: 4.0000 mg | ORAL_TABLET | Freq: Four times a day (QID) | ORAL | Status: DC | PRN

## 2018-12-02 MED ORDER — METRONIDAZOLE IN NACL 5-0.79 MG/ML-% IV SOLN
500.0000 mg | Freq: Three times a day (TID) | INTRAVENOUS | Status: DC
  Administered 2018-12-02 (×2): 500 mg via INTRAVENOUS
  Filled 2018-12-02 (×2): qty 100

## 2018-12-02 MED ORDER — HYDROCODONE-ACETAMINOPHEN 5-325 MG PO TABS: 1.0000 | ORAL_TABLET | ORAL | Status: DC | PRN

## 2018-12-02 MED ORDER — TRAMADOL HCL 50 MG PO TABS: 50.0000 mg | ORAL_TABLET | Freq: Four times a day (QID) | ORAL | 0 refills | Status: DC | PRN

## 2018-12-02 MED ORDER — KCL IN DEXTROSE-NACL 20-5-0.45 MEQ/L-%-% IV SOLN
INTRAVENOUS | Status: DC
  Administered 2018-12-02: 01:00:00 via INTRAVENOUS
  Filled 2018-12-02 (×2): qty 1000

## 2018-12-02 NOTE — Plan of Care (Signed)
Pt is stable, denies any pain or discomfort at this time. Plan of care reviewed .

## 2018-12-02 NOTE — Discharge Summary (Signed)
[facility] Discharge Summary   Patient ID: Kayla Bowman MRN: [id number] DOB/AGE: 02-07-1971 47 y.o.  Admit date: 12/01/2018 Discharge date: 12/02/2018  Admitting Diagnosis: Acute appendicitis  Discharge Diagnosis Acute appendicitis  Consultants None  Imaging: Ct Abdomen Pelvis W Contrast  Result Date: 12/01/2018 CLINICAL DATA:  Generalized abdominal pain, nausea and vomiting for 1 day. EXAM: CT ABDOMEN AND PELVIS WITH CONTRAST TECHNIQUE: Multidetector CT imaging of the abdomen and pelvis was performed using the standard protocol following bolus administration of intravenous contrast. CONTRAST:  124mL ISOVUE-300 IOPAMIDOL (ISOVUE-300) INJECTION 61% COMPARISON:  None. FINDINGS: Lower chest: No significant pulmonary nodules or acute consolidative airspace disease. Hepatobiliary: Normal liver size. Subcentimeter hypodense right liver lobe lesion, too small to characterize, which requires no follow-up unless the patient has risk factors for liver malignancy. No additional liver lesions. Normal gallbladder with no radiopaque cholelithiasis. No biliary ductal dilatation. Pancreas: Normal, with no mass or duct dilation. Spleen: Normal size spleen. Granulomatous splenic calcification. No splenic masses. Adrenals/Urinary Tract: Normal adrenals. Normal kidneys with no hydronephrosis and no renal mass. Normal bladder. Stomach/Bowel: Normal non-distended stomach. Normal caliber small bowel with no small bowel wall thickening. Appendix is dilated (11 mm diameter) and thick walled with appendiceal wall hyperenhancement and periappendiceal fat stranding, compatible with acute appendicitis. Appendix: Location: Right lower quadrant Diameter: 11 mm Appendicolith: Not present Mucosal hyper-enhancement: Present Extraluminal gas: Not present Periappendiceal collection: Not present Normal large bowel with no diverticulosis, large bowel wall thickening or pericolonic fat stranding. Vascular/Lymphatic:  Normal caliber abdominal aorta. Patent portal, splenic, hepatic and renal veins. No pathologically enlarged lymph nodes in the abdomen or pelvis. Reproductive: No adnexal masses. Grossly normal retroverted uterus with a nabothian cyst in the anterior cervix. Other: No pneumoperitoneum, ascites or focal fluid collection. Musculoskeletal: No aggressive appearing focal osseous lesions. IMPRESSION: Acute appendicitis.  No abscess.  No free air. These results were called by telephone at the time of interpretation on 12/01/2018 at 8:13 pm to Dr. Laverta Baltimore , who verbally acknowledged these results. Electronically Signed   By: Ilona Sorrel M.D.   On: 12/01/2018 20:14    Procedures Dr. Lucia Gaskins (12/01/18) - Laparoscopic Appendectomy  Hospital Course:  Patient is a 47 year old female who presented to [facility] with abdominal pain.  Workup showed acute appendicitis.  Patient was admitted and underwent procedure listed above.  Tolerated procedure well and was transferred to the floor.  Diet was advanced as tolerated.  On POD#1, the patient was voiding well, tolerating diet, ambulating well, pain well controlled, vital signs stable, incisions c/d/i and felt stable for discharge home.  Patient will follow up in our office in 2 weeks and knows to call with questions or concerns. She will call to confirm appointment date/time.    Physical Exam: General:  Alert, NAD, pleasant, comfortable Abd:  Soft, ND, mild tenderness, incisions C/D/I   Allergies as of 12/02/2018   No Known Allergies     Medication List    TAKE these medications   acetaminophen 325 MG tablet Commonly known as:  TYLENOL Take 2 tablets (650 mg total) by mouth every 6 (six) hours as needed for mild pain.   aspirin EC 81 MG tablet Take 81 mg by mouth every 4 (four) hours as needed for mild pain.   B-complex with vitamin C tablet Take 1 tablet by mouth daily.   ibuprofen 200 MG tablet Commonly known as:  MOTRIN IB Take 2 tablets (400 mg total) by  mouth every 6 (six) hours as needed  for mild pain.   LUMIFY 0.025 % Soln Generic drug:  Brimonidine Tartrate Apply 1 drop to eye every morning.   SYSTANE 0.4-0.3 % Soln Generic drug:  Polyethyl Glycol-Propyl Glycol Apply 1 drop to eye 2 (two) times daily.   traMADol 50 MG tablet Commonly known as:  ULTRAM Take 1 tablet (50 mg total) by mouth every 6 (six) hours as needed for moderate pain.   vitamin B-12 1000 MCG tablet Commonly known as:  CYANOCOBALAMIN Take 1,000 mcg by mouth daily.   vitamin C 1000 MG tablet Take 1,000 mg by mouth daily.   vitamin E 100 UNIT capsule Take 100 Units by mouth daily.        Follow-up Information    Surgery, [place]. Call.   Specialty:  General Surgery Why:  Call to confirm appointment date/time. Please arrive 30 min prior to appointment time. Bring photo ID and insurance information.  Contact information: [address] [place] [address] [phone]           Signed: Brigid Re, [facility] Surgery 12/02/2018, 9:02 AM Pager: [phone] Consults: [phone] Mon-Fri 7:00 am-4:30 pm Sat-Sun 7:00 am-11:30 am

## 2018-12-02 NOTE — Anesthesia Postprocedure Evaluation (Signed)
Anesthesia Post Note  Patient: Kayla Bowman  Procedure(s) Performed: APPENDECTOMY LAPAROSCOPIC (N/A Abdomen)     Patient location during evaluation: PACU Anesthesia Type: General Level of consciousness: awake and alert Pain management: pain level controlled Vital Signs Assessment: post-procedure vital signs reviewed and stable Respiratory status: spontaneous breathing, nonlabored ventilation, respiratory function stable and patient connected to nasal cannula oxygen Cardiovascular status: blood pressure returned to baseline and stable Postop Assessment: no apparent nausea or vomiting Anesthetic complications: no    Last Vitals:  Vitals:   12/02/18 0000 12/02/18 0015  BP: 94/64 (!) 109/57  Pulse: 63 70  Resp: 13 16  Temp:    SpO2: 100% 100%    Last Pain:  Vitals:   12/02/18 0015  TempSrc:   PainSc: 3                  Montez Hageman

## 2018-12-02 NOTE — ED Provider Notes (Signed)
[facility] Provider Note   CSN: [id number] Arrival date & time: 12/01/18  1636     History   Chief Complaint Chief Complaint  Patient presents with  . Abdominal Pain  . Fever    HPI Kayla Bowman is a 47 y.o. female presenting for evaluation of abdominal pain and fever.  Patient states she developed acute onset abdominal pain around 930 this morning.  She has associated nausea, has thrown up x2.  Patient states she went to her primary care, was found to have a low-grade fever, and it was recommended she come to the ER for further evaluation.  She has not taken anything for her symptoms including Tylenol or ibuprofen.  Nothing makes her pain better or worse.  She denies history of similar pain.  She denies diarrhea or constipation.  She has no medical problems, takes no medications daily.  She denies chest pain, shortness of breath, urinary symptoms.  HPI  History reviewed. No pertinent past medical history.  Patient Active Problem List   Diagnosis Date Noted  . Right lower quadrant abdominal pain 12/01/2018  . Left lower quadrant abdominal pain 12/01/2018  . Non-intractable vomiting 12/01/2018  . Acute appendicitis 12/01/2018  . Viral syndrome 04/14/2018  . Ear discomfort, left 04/14/2018  . Muscle ache 04/14/2018    History reviewed. No pertinent surgical history.   OB History   No obstetric history on file.      Home Medications    Prior to Admission medications   Not on File    Family History History reviewed. No pertinent family history.  Social History Social History   Tobacco Use  . Smoking status: Never Smoker  . Smokeless tobacco: Never Used  Substance Use Topics  . Alcohol use: Not Currently  . Drug use: Never     Allergies   Patient has no known allergies.   Review of Systems Review of Systems  Gastrointestinal: Positive for abdominal pain, nausea and vomiting.  All other systems reviewed and are  negative.    Physical Exam Updated Vital Signs BP 94/64 (BP Location: Right Arm)   Pulse 63   Temp 99.3 F (37.4 C)   Resp 13   Ht 5\' 5"  (1.651 m)   Wt 54.9 kg   LMP 11/13/2018   SpO2 100%   BMI 20.14 kg/m   Physical Exam Vitals signs and nursing note reviewed.  Constitutional:      General: She is not in acute distress.    Appearance: She is well-developed.     Comments: Appears nontoxic  HENT:     Head: Normocephalic and atraumatic.  Eyes:     Conjunctiva/sclera: Conjunctivae normal.     Pupils: Pupils are equal, round, and reactive to light.  Neck:     Musculoskeletal: Normal range of motion and neck supple.  Cardiovascular:     Rate and Rhythm: Normal rate and regular rhythm.  Pulmonary:     Effort: Pulmonary effort is normal. No respiratory distress.     Breath sounds: Normal breath sounds. No wheezing.  Abdominal:     General: There is no distension.     Palpations: Abdomen is soft.     Tenderness: There is abdominal tenderness in the right lower quadrant and left lower quadrant. There is guarding.     Comments: ttp of left and right lower quadrants with voluntary guarding.  Negative rebound.  No masses.  Musculoskeletal: Normal range of motion.  Skin:    General: Skin is  warm and dry.     Capillary Refill: Capillary refill takes less than 2 seconds.  Neurological:     Mental Status: She is alert and oriented to person, place, and time.      ED Treatments / Results  Labs (all labs ordered are listed, but only abnormal results are displayed) Labs Reviewed  CBC WITH DIFFERENTIAL/PLATELET - Abnormal; Notable for the following components:      Result Value   WBC 18.1 (*)    Neutro Abs 16.5 (*)    Lymphs Abs 0.5 (*)    All other components within normal limits  COMPREHENSIVE METABOLIC PANEL - Abnormal; Notable for the following components:   Glucose, Bld 109 (*)    Alkaline Phosphatase 30 (*)    All other components within normal limits  LIPASE, BLOOD   HCG, SERUM, QUALITATIVE  SURGICAL PATHOLOGY    EKG None  Radiology Ct Abdomen Pelvis W Contrast  Result Date: 12/01/2018 CLINICAL DATA:  Generalized abdominal pain, nausea and vomiting for 1 day. EXAM: CT ABDOMEN AND PELVIS WITH CONTRAST TECHNIQUE: Multidetector CT imaging of the abdomen and pelvis was performed using the standard protocol following bolus administration of intravenous contrast. CONTRAST:  11mL ISOVUE-300 IOPAMIDOL (ISOVUE-300) INJECTION 61% COMPARISON:  None. FINDINGS: Lower chest: No significant pulmonary nodules or acute consolidative airspace disease. Hepatobiliary: Normal liver size. Subcentimeter hypodense right liver lobe lesion, too small to characterize, which requires no follow-up unless the patient has risk factors for liver malignancy. No additional liver lesions. Normal gallbladder with no radiopaque cholelithiasis. No biliary ductal dilatation. Pancreas: Normal, with no mass or duct dilation. Spleen: Normal size spleen. Granulomatous splenic calcification. No splenic masses. Adrenals/Urinary Tract: Normal adrenals. Normal kidneys with no hydronephrosis and no renal mass. Normal bladder. Stomach/Bowel: Normal non-distended stomach. Normal caliber small bowel with no small bowel wall thickening. Appendix is dilated (11 mm diameter) and thick walled with appendiceal wall hyperenhancement and periappendiceal fat stranding, compatible with acute appendicitis. Appendix: Location: Right lower quadrant Diameter: 11 mm Appendicolith: Not present Mucosal hyper-enhancement: Present Extraluminal gas: Not present Periappendiceal collection: Not present Normal large bowel with no diverticulosis, large bowel wall thickening or pericolonic fat stranding. Vascular/Lymphatic: Normal caliber abdominal aorta. Patent portal, splenic, hepatic and renal veins. No pathologically enlarged lymph nodes in the abdomen or pelvis. Reproductive: No adnexal masses. Grossly normal retroverted uterus with  a nabothian cyst in the anterior cervix. Other: No pneumoperitoneum, ascites or focal fluid collection. Musculoskeletal: No aggressive appearing focal osseous lesions. IMPRESSION: Acute appendicitis.  No abscess.  No free air. These results were called by telephone at the time of interpretation on 12/01/2018 at 8:13 pm to Dr. Laverta Baltimore , who verbally acknowledged these results. Electronically Signed   By: Ilona Sorrel M.D.   On: 12/01/2018 20:14    Procedures Procedures (including critical care time)  Medications Ordered in ED Medications  cefTRIAXone (ROCEPHIN) 2 g in sodium chloride 0.9 % 100 mL IVPB (2 g Intravenous New Bag/Given 12/01/18 2038)    And  metroNIDAZOLE (FLAGYL) IVPB 500 mg ( Intravenous MAR Hold 12/01/18 2207)  lactated ringers infusion ( Intravenous Anesthesia Volume Adjustment 12/01/18 2334)  fentaNYL (SUBLIMAZE) injection 25-50 mcg (has no administration in time range)  meperidine (DEMEROL) injection 6.25-12.5 mg (has no administration in time range)  metoCLOPramide (REGLAN) injection 10 mg (has no administration in time range)  iopamidol (ISOVUE-300) 61 % injection 100 mL (100 mLs Intravenous Contrast Given 12/01/18 1938)     Initial Impression / Assessment and Plan / ED  Course  I have reviewed the triage vital signs and the nursing notes.  Pertinent labs & imaging results that were available during my care of the patient were reviewed by me and considered in my medical decision making (see chart for details).     Patient presenting for evaluation of abdominal tenderness, nausea, vomiting, and fever.  Physical exam concerning, patient with no abdominal tenderness.  She has associated nausea and vomiting without diarrhea.  Additionally, patient with low-grade fever.  Concern for appendicitis versus diverticulitis versus colitis.  Will obtain labs, CT and reassess.  Labs with elevated white count at 18.  Otherwise, reassuring.  Hemoglobin, electrolytes, kidney, liver, and  pancreatic function reassuring.  CT pending.  CT concerning for acute appendicitis.  Will consult with general surgery.  Discussed with Dr. Lucia Gaskins from general surgery, who recommends transfer to [facility] long for appendectomy.  Antibiotics started.  Patient transported via [place].  Final Clinical Impressions(s) / ED Diagnoses   Final diagnoses:  Acute appendicitis, unspecified acute appendicitis type    ED Discharge Orders    None       Franchot Heidelberg, PA-C 12/02/18 0026    Margette Fast, MD 12/02/18 [phone]

## 2018-12-02 NOTE — Discharge Instructions (Signed)
[facility] LAPAROSCOPIC SURGERY: POST OP INSTRUCTIONS Always review your discharge instruction sheet given to you by the facility where your surgery was performed. IF YOU HAVE DISABILITY OR FAMILY LEAVE FORMS, YOU MUST BRING THEM TO THE OFFICE FOR PROCESSING.   DO NOT GIVE THEM TO YOUR DOCTOR.  PAIN CONTROL  1. First take acetaminophen (Tylenol) AND/or ibuprofen (Advil) to control your pain after surgery.  Follow directions on package.  Taking acetaminophen (Tylenol) and/or ibuprofen (Advil) regularly after surgery will help to control your pain and lower the amount of prescription pain medication you may need.  You should not take more than 3,000 mg (3 grams) of acetaminophen (Tylenol) in 24 hours.  You should not take ibuprofen (Advil), aleve, motrin, naprosyn or other NSAIDS if you have a history of stomach ulcers or chronic kidney disease.  2. A prescription for pain medication may be given to you upon discharge.  Take your pain medication as prescribed, if you still have uncontrolled pain after taking acetaminophen (Tylenol) or ibuprofen (Advil). 3. Use ice packs to help control pain. 4. If you need a refill on your pain medication, please contact your pharmacy.  They will contact our office to request authorization. Prescriptions will not be filled after 5pm or on week-ends.  HOME MEDICATIONS 5. Take your usually prescribed medications unless otherwise directed.  DIET 6. You should follow a light diet the first few days after arrival home.  Be sure to include lots of fluids daily. Avoid fatty, fried foods.   CONSTIPATION 7. It is common to experience some constipation after surgery and if you are taking pain medication.  Increasing fluid intake and taking a stool softener (such as Colace) will usually help or prevent this problem from occurring.  A mild laxative (Milk of Magnesia or Miralax) should be taken according to package instructions if there are no bowel  movements after 48 hours.  WOUND/INCISION CARE 8. Most patients will experience some swelling and bruising in the area of the incisions.  Ice packs will help.  Swelling and bruising can take several days to resolve.  9. Unless discharge instructions indicate otherwise, follow guidelines below  a. STERI-STRIPS - you may remove your outer bandages 48 hours after surgery, and you may shower at that time.  You have steri-strips (small skin tapes) in place directly over the incision.  These strips should be left on the skin for 7-10 days.   b. DERMABOND/SKIN GLUE - you may shower in 24 hours.  The glue will flake off over the next 2-3 weeks. 10. Any sutures or staples will be removed at the office during your follow-up visit.  ACTIVITIES 11. You may resume regular (light) daily activities beginning the next day--such as daily self-care, walking, climbing stairs--gradually increasing activities as tolerated.  You may have sexual intercourse when it is comfortable.  Refrain from any heavy lifting or straining until approved by your doctor. a. You may drive when you are no longer taking prescription pain medication, you can comfortably wear a seatbelt, and you can safely maneuver your car and apply brakes.  FOLLOW-UP 12. You should see your doctor in the office for a follow-up appointment approximately 2-3 weeks after your surgery.  You should have been given your post-op/follow-up appointment when your surgery was scheduled.  If you did not receive a post-op/follow-up appointment, make sure that you call for this appointment within a day or two after you arrive home to insure a convenient appointment time.  WHEN   TO CALL YOUR DOCTOR: 1. Fever over 101.0 2. Inability to urinate 3. Continued bleeding from incision. 4. Increased pain, redness, or drainage from the incision. 5. Increasing abdominal pain  The clinic staff is available to answer your questions during regular business hours.  Please don't  hesitate to call and ask to speak to one of the nurses for clinical concerns.  If you have a medical emergency, go to the nearest emergency room or call 911.  A surgeon from [facility] is always on call at the hospital. [address], [address], [place], [place]  [address] ? [address] [address], [place], [place]   [address] [phone] ? [phone] ? FAX [phone] Web site: [email or web address]  

## 2019-03-24 DIAGNOSIS — R21 Rash and other nonspecific skin eruption: Secondary | ICD-10-CM | POA: Diagnosis not present

## 2019-04-08 DIAGNOSIS — M2560 Stiffness of unspecified joint, not elsewhere classified: Secondary | ICD-10-CM | POA: Diagnosis not present

## 2019-04-08 DIAGNOSIS — M9903 Segmental and somatic dysfunction of lumbar region: Secondary | ICD-10-CM | POA: Diagnosis not present

## 2019-04-15 DIAGNOSIS — M9903 Segmental and somatic dysfunction of lumbar region: Secondary | ICD-10-CM | POA: Diagnosis not present

## 2019-04-15 DIAGNOSIS — M2560 Stiffness of unspecified joint, not elsewhere classified: Secondary | ICD-10-CM | POA: Diagnosis not present

## 2019-04-22 DIAGNOSIS — M2560 Stiffness of unspecified joint, not elsewhere classified: Secondary | ICD-10-CM | POA: Diagnosis not present

## 2019-04-22 DIAGNOSIS — M9903 Segmental and somatic dysfunction of lumbar region: Secondary | ICD-10-CM | POA: Diagnosis not present

## 2019-04-29 DIAGNOSIS — M2560 Stiffness of unspecified joint, not elsewhere classified: Secondary | ICD-10-CM | POA: Diagnosis not present

## 2019-04-29 DIAGNOSIS — M9903 Segmental and somatic dysfunction of lumbar region: Secondary | ICD-10-CM | POA: Diagnosis not present

## 2019-06-28 ENCOUNTER — Ambulatory Visit: Payer: Commercial Managed Care - PPO | Admitting: Family Medicine

## 2019-06-28 ENCOUNTER — Telehealth: Payer: Self-pay

## 2019-06-28 ENCOUNTER — Encounter: Payer: Self-pay | Admitting: Family Medicine

## 2019-06-28 VITALS — BP 118/70 | HR 79 | Ht 65.0 in | Wt 130.5 lb

## 2019-06-28 DIAGNOSIS — R1030 Lower abdominal pain, unspecified: Secondary | ICD-10-CM | POA: Diagnosis not present

## 2019-06-28 LAB — CBC
HCT: 36.3 % (ref 35.0–45.0)
Hemoglobin: 12 g/dL (ref 11.7–15.5)
MCH: 29.6 pg (ref 27.0–33.0)
MCHC: 33.1 g/dL (ref 32.0–36.0)
MCV: 89.4 fL (ref 80.0–100.0)
MPV: 10.1 fL (ref 7.5–12.5)
Platelets: 301 10*3/uL (ref 140–400)
RBC: 4.06 10*6/uL (ref 3.80–5.10)
RDW: 12.5 % (ref 11.0–15.0)
WBC: 7.2 10*3/uL (ref 3.8–10.8)

## 2019-06-28 LAB — POCT URINE PREGNANCY: Preg Test, Ur: NEGATIVE

## 2019-06-28 NOTE — Telephone Encounter (Signed)

## 2019-06-28 NOTE — Progress Notes (Signed)
Established Patient Office Visit  Subjective:  Patient ID: Kayla Bowman, female    DOB: [birth year]  Age: 48 y.o. MRN: [id number]  CC:  Chief Complaint  Patient presents with  . Abdominal Pain    HPI Kayla Bowman presents for treatment and evaluation of a 2-day history of lower abdominal pain with bloating and pressure.  Patient denies fevers chills, dysuria, vaginal discharge, frequency of urination.  Small stool volume this morning but was otherwise normal.  Normally stools 3 times a day.  Recent appendectomy December of this past year.  Status post salpingectomy on the right with preservation of the right ovary some years ago for ectopic pregnancy.  LMP was 6/29 of last month.  She is using no form of contraception.  History reviewed. No pertinent past medical history.  Past Surgical History:  Procedure Laterality Date  . LAPAROSCOPIC APPENDECTOMY N/A 12/01/2018   Procedure: APPENDECTOMY LAPAROSCOPIC;  Surgeon: Alphonsa Overall, MD;  Location: [facility];  Service: General;  Laterality: N/A;    History reviewed. No pertinent family history.  Social History   Socioeconomic History  . Marital status: Married    Spouse name: Not on file  . Number of children: Not on file  . Years of education: Not on file  . Highest education level: Not on file  Occupational History  . Not on file  Social Needs  . Financial resource strain: Not on file  . Food insecurity    Worry: Not on file    Inability: Not on file  . Transportation needs    Medical: Not on file    Non-medical: Not on file  Tobacco Use  . Smoking status: Never Smoker  . Smokeless tobacco: Never Used  Substance and Sexual Activity  . Alcohol use: Not Currently  . Drug use: Never  . Sexual activity: Not on file  Lifestyle  . Physical activity    Days per week: Not on file    Minutes per session: Not on file  . Stress: Not on file  Relationships  . Social Herbalist on phone: Not on file    Gets  together: Not on file    Attends religious service: Not on file    Active member of club or organization: Not on file    Attends meetings of clubs or organizations: Not on file    Relationship status: Not on file  . Intimate partner violence    Fear of current or ex partner: Not on file    Emotionally abused: Not on file    Physically abused: Not on file    Forced sexual activity: Not on file  Other Topics Concern  . Not on file  Social History Narrative  . Not on file    Outpatient Medications Prior to Visit  Medication Sig Dispense Refill  . acetaminophen (TYLENOL) 325 MG tablet Take 2 tablets (650 mg total) by mouth every 6 (six) hours as needed for mild pain.    . Ascorbic Acid (VITAMIN C) 1000 MG tablet Take 1,000 mg by mouth daily.    Marland Kitchen aspirin EC 81 MG tablet Take 81 mg by mouth every 4 (four) hours as needed for mild pain.    . B Complex-C (B-COMPLEX WITH VITAMIN C) tablet Take 1 tablet by mouth daily.    . Brimonidine Tartrate (LUMIFY) 0.025 % SOLN Apply 1 drop to eye every morning.    Marland Kitchen ibuprofen (MOTRIN IB) 200 MG tablet Take 2 tablets (400 mg total)  by mouth every 6 (six) hours as needed for mild pain.    Vladimir Faster Glycol-Propyl Glycol (SYSTANE) 0.4-0.3 % SOLN Apply 1 drop to eye 2 (two) times daily.    . vitamin B-12 (CYANOCOBALAMIN) 1000 MCG tablet Take 1,000 mcg by mouth daily.    . vitamin E 100 UNIT capsule Take 100 Units by mouth daily.    . traMADol (ULTRAM) 50 MG tablet Take 1 tablet (50 mg total) by mouth every 6 (six) hours as needed for moderate pain. 15 tablet 0   No facility-administered medications prior to visit.     No Known Allergies  ROS Review of Systems  Constitutional: Negative for chills, diaphoresis, fatigue, fever and unexpected weight change.  HENT: Negative.   Eyes: Negative for photophobia and visual disturbance.  Respiratory: Negative.   Cardiovascular: Negative.   Gastrointestinal: Positive for abdominal pain. Negative for anal  bleeding, blood in stool, diarrhea, nausea, rectal pain and vomiting.  Endocrine: Negative for polyphagia and polyuria.  Genitourinary: Negative for difficulty urinating, dysuria, frequency, menstrual problem, vaginal bleeding, vaginal discharge and vaginal pain.  Allergic/Immunologic: Negative for immunocompromised state.  Neurological: Negative for headaches.  Hematological: Does not bruise/bleed easily.  Psychiatric/Behavioral: Negative.       Objective:    Physical Exam  Constitutional: She is oriented to person, place, and time. She appears well-developed and well-nourished. No distress.  HENT:  Head: Normocephalic and atraumatic.  Right Ear: External ear normal.  Left Ear: External ear normal.  Mouth/Throat: Oropharynx is clear and moist. No oropharyngeal exudate.  Eyes: Pupils are equal, round, and reactive to light. Conjunctivae are normal. Right eye exhibits no discharge. Left eye exhibits no discharge. No scleral icterus.  Neck: Neck supple. No JVD present. No tracheal deviation present. No thyromegaly present.  Cardiovascular: Normal rate, regular rhythm and normal heart sounds.  Pulmonary/Chest: Effort normal and breath sounds normal. No stridor.  Abdominal: Bowel sounds are normal. She exhibits no distension. There is abdominal tenderness. There is no rebound and no guarding.  Lymphadenopathy:    She has no cervical adenopathy.  Neurological: She is alert and oriented to person, place, and time.  Skin: Skin is warm and dry. She is not diaphoretic.  Psychiatric: She has a normal mood and affect. Her behavior is normal.    BP 118/70   Pulse 79   Ht 5\' 5"  (1.651 m)   Wt 130 lb 8 oz (59.2 kg)   SpO2 94%   BMI 21.72 kg/m  Wt Readings from Last 3 Encounters:  06/28/19 130 lb 8 oz (59.2 kg)  12/01/18 121 lb (54.9 kg)  04/14/18 130 lb 6 oz (59.1 kg)   BP Readings from Last 3 Encounters:  06/28/19 118/70  12/02/18 (!) 96/54  12/01/18 110/60   Guideline developer:   UpToDate (see UpToDate for funding source) Date Released: June 2014  Health Maintenance Due  Topic Date Due  . HIV Screening  09/16/1986  . TETANUS/TDAP  09/16/1990  . PAP SMEAR-Modifier  09/16/1992    There are no preventive care reminders to display for this patient.  No results found for: TSH Lab Results  Component Value Date   WBC 18.1 (H) 12/01/2018   HGB 13.5 12/01/2018   HCT 42.2 12/01/2018   MCV 90.8 12/01/2018   PLT 286 12/01/2018   Lab Results  Component Value Date   NA 139 12/01/2018   K 4.1 12/01/2018   CO2 23 12/01/2018   GLUCOSE 109 (H) 12/01/2018   BUN 11 12/01/2018  CREATININE 0.65 12/01/2018   BILITOT 1.1 12/01/2018   ALKPHOS 30 (L) 12/01/2018   AST 18 12/01/2018   ALT 15 12/01/2018   PROT 6.8 12/01/2018   ALBUMIN 4.4 12/01/2018   CALCIUM 8.9 12/01/2018   ANIONGAP 8 12/01/2018   GFR 98.49 12/01/2018   No results found for: CHOL No results found for: HDL No results found for: LDLCALC No results found for: TRIG No results found for: CHOLHDL No results found for: HGBA1C    Assessment & Plan:   Problem List Items Addressed This Visit    None    Visit Diagnoses    Lower abdominal pain    -  Primary   Relevant Orders   POCT urine pregnancy   Urinalysis, Routine w reflex microscopic   CBC      No orders of the defined types were placed in this encounter.   Follow-up: Return in about 2 days (around 06/30/2019), or if symptoms worsen or fail to improve.

## 2019-06-29 LAB — URINALYSIS, ROUTINE W REFLEX MICROSCOPIC
Bilirubin Urine: NEGATIVE
Hgb urine dipstick: NEGATIVE
Ketones, ur: NEGATIVE
Leukocytes,Ua: NEGATIVE
Nitrite: NEGATIVE
RBC / HPF: NONE SEEN (ref 0–?)
Specific Gravity, Urine: 1.01 (ref 1.000–1.030)
Total Protein, Urine: NEGATIVE
Urine Glucose: NEGATIVE
Urobilinogen, UA: 0.2 (ref 0.0–1.0)
pH: 6 (ref 5.0–8.0)

## 2019-11-12 ENCOUNTER — Other Ambulatory Visit: Payer: Self-pay

## 2019-11-12 ENCOUNTER — Ambulatory Visit (INDEPENDENT_AMBULATORY_CARE_PROVIDER_SITE_OTHER): Payer: Commercial Managed Care - PPO | Admitting: Family Medicine

## 2019-11-12 DIAGNOSIS — R300 Dysuria: Secondary | ICD-10-CM

## 2019-11-12 MED ORDER — CEPHALEXIN 500 MG PO CAPS: 500.0000 mg | ORAL_CAPSULE | Freq: Three times a day (TID) | ORAL | 0 refills | Status: DC

## 2019-11-12 MED ORDER — PHENAZOPYRIDINE HCL 200 MG PO TABS: 200.0000 mg | ORAL_TABLET | Freq: Three times a day (TID) | ORAL | 0 refills | Status: DC | PRN

## 2019-11-12 NOTE — Progress Notes (Signed)
This visit type was conducted due to national recommendations for restrictions regarding the COVID-19 pandemic in an effort to limit this patient's exposure and mitigate transmission in our community.   Virtual Visit via Video Note  I connected with Kayla Bowman on 11/12/19 at 11:00 AM EST by a video enabled telemedicine application and verified that I am speaking with the correct person using two identifiers.  Location patient: home Location provider:work or home office Persons participating in the virtual visit: patient, provider  I discussed the limitations of evaluation and management by telemedicine and the availability of in person appointments. The patient expressed understanding and agreed to proceed.   HPI:  Kayla Bowman relates onset Wednesday morning of some urine frequency and burning with urination.  She had some little bit of blood which she has had previously with acute cystitis.  The symptoms are very similar.  No flank pain.  No nausea or vomiting.  No fever.  She had some leftover amoxicillin and took a couple doses with only minimal improvement.  She states medication was out of date.  Denies any other treatment for recent UTI.  ROS: See pertinent positives and negatives per HPI.  No past medical history on file.  Past Surgical History:  Procedure Laterality Date  . LAPAROSCOPIC APPENDECTOMY N/A 12/01/2018   Procedure: APPENDECTOMY LAPAROSCOPIC;  Surgeon: Alphonsa Overall, MD;  Location: [facility];  Service: General;  Laterality: N/A;    No family history on file.  SOCIAL HX: Non-smoker   Current Outpatient Medications:  .  acetaminophen (TYLENOL) 325 MG tablet, Take 2 tablets (650 mg total) by mouth every 6 (six) hours as needed for mild pain., Disp: , Rfl:  .  Ascorbic Acid (VITAMIN C) 1000 MG tablet, Take 1,000 mg by mouth daily., Disp: , Rfl:  .  aspirin EC 81 MG tablet, Take 81 mg by mouth every 4 (four) hours as needed for mild pain., Disp: , Rfl:  .  B Complex-C  (B-COMPLEX WITH VITAMIN C) tablet, Take 1 tablet by mouth daily., Disp: , Rfl:  .  Brimonidine Tartrate (LUMIFY) 0.025 % SOLN, Apply 1 drop to eye every morning., Disp: , Rfl:  .  cephALEXin (KEFLEX) 500 MG capsule, Take 1 capsule (500 mg total) by mouth 3 (three) times daily., Disp: 15 capsule, Rfl: 0 .  ibuprofen (MOTRIN IB) 200 MG tablet, Take 2 tablets (400 mg total) by mouth every 6 (six) hours as needed for mild pain., Disp: , Rfl:  .  phenazopyridine (PYRIDIUM) 200 MG tablet, Take 1 tablet (200 mg total) by mouth 3 (three) times daily as needed for pain., Disp: 15 tablet, Rfl: 0 .  Polyethyl Glycol-Propyl Glycol (SYSTANE) 0.4-0.3 % SOLN, Apply 1 drop to eye 2 (two) times daily., Disp: , Rfl:  .  vitamin B-12 (CYANOCOBALAMIN) 1000 MCG tablet, Take 1,000 mcg by mouth daily., Disp: , Rfl:  .  vitamin E 100 UNIT capsule, Take 100 Units by mouth daily., Disp: , Rfl:   EXAM:  VITALS per patient if applicable:  GENERAL: alert, oriented, appears well and in no acute distress  HEENT: atraumatic, conjunttiva clear, no obvious abnormalities on inspection of external nose and ears  NECK: normal movements of the head and neck  LUNGS: on inspection no signs of respiratory distress, breathing rate appears normal, no obvious gross SOB, gasping or wheezing  CV: no obvious cyanosis  MS: moves all visible extremities without noticeable abnormality  PSYCH/NEURO: pleasant and cooperative, no obvious depression or anxiety, speech and thought processing  grossly intact  ASSESSMENT AND PLAN:  Discussed the following assessment and plan:  Dysuria.  Suspect uncomplicated cystitis  -Start Keflex 500 mg 3 times daily for 5 days -Patient requesting refill of Pyridium 200 mg every 8 hours as needed for discomfort -Plenty of fluids and follow-up with primary if symptoms not resolved by early next week     I discussed the assessment and treatment plan with the patient. The patient was provided an  opportunity to ask questions and all were answered. The patient agreed with the plan and demonstrated an understanding of the instructions.   The patient was advised to call back or seek an in-person evaluation if the symptoms worsen or if the condition fails to improve as anticipated.   Carolann Littler, MD

## 2019-11-17 ENCOUNTER — Other Ambulatory Visit: Payer: Self-pay

## 2019-11-17 ENCOUNTER — Other Ambulatory Visit: Payer: Commercial Managed Care - PPO

## 2019-11-17 ENCOUNTER — Ambulatory Visit (INDEPENDENT_AMBULATORY_CARE_PROVIDER_SITE_OTHER): Payer: Commercial Managed Care - PPO | Admitting: Family Medicine

## 2019-11-17 ENCOUNTER — Encounter: Payer: Self-pay | Admitting: Family Medicine

## 2019-11-17 DIAGNOSIS — N3001 Acute cystitis with hematuria: Secondary | ICD-10-CM

## 2019-11-17 LAB — URINALYSIS, ROUTINE W REFLEX MICROSCOPIC
Bilirubin Urine: NEGATIVE
Hgb urine dipstick: NEGATIVE
Leukocytes,Ua: NEGATIVE
Nitrite: NEGATIVE
RBC / HPF: NONE SEEN (ref 0–?)
Specific Gravity, Urine: 1.025 (ref 1.000–1.030)
Total Protein, Urine: NEGATIVE
Urine Glucose: NEGATIVE
Urobilinogen, UA: 0.2 (ref 0.0–1.0)
pH: 5.5 (ref 5.0–8.0)

## 2019-11-17 MED ORDER — PHENAZOPYRIDINE HCL 200 MG PO TABS: 200.0000 mg | ORAL_TABLET | Freq: Three times a day (TID) | ORAL | 0 refills | Status: DC | PRN

## 2019-11-17 MED ORDER — CIPROFLOXACIN HCL 500 MG PO TABS: 500.0000 mg | ORAL_TABLET | Freq: Two times a day (BID) | ORAL | 0 refills | Status: AC

## 2019-11-17 NOTE — Progress Notes (Signed)
Established Patient Office Visit  Subjective:  Patient ID: Kayla Bowman, female    DOB: 05-21-1971  Age: 48 y.o. MRN: [id number]  CC:  Chief Complaint  Patient presents with  . Urinary Tract Infection    did video visit last week, given Keflex. Pt is on the last day of it and doesn't feel like she is getting any better.    HPI Kayla Bowman presents for follow-up of her recent UTI that is been treated with Keflex 500 twice daily for 3 days.  Continues with some mild dysuria, frequency and hematuria.  She has had no fevers chills nausea or vomiting.  There is been no abdominal pain.  There is been no unusual vaginal discharge or pruritus.  She is not sexually active.  Her husband has been out of town for an extended period of time.  History reviewed. No pertinent past medical history.  Past Surgical History:  Procedure Laterality Date  . LAPAROSCOPIC APPENDECTOMY N/A 12/01/2018   Procedure: APPENDECTOMY LAPAROSCOPIC;  Surgeon: Alphonsa Overall, MD;  Location: [facility];  Service: General;  Laterality: N/A;    History reviewed. No pertinent family history.  Social History   Socioeconomic History  . Marital status: Married    Spouse name: Not on file  . Number of children: Not on file  . Years of education: Not on file  . Highest education level: Not on file  Occupational History  . Not on file  Social Needs  . Financial resource strain: Not on file  . Food insecurity    Worry: Not on file    Inability: Not on file  . Transportation needs    Medical: Not on file    Non-medical: Not on file  Tobacco Use  . Smoking status: Never Smoker  . Smokeless tobacco: Never Used  Substance and Sexual Activity  . Alcohol use: Not Currently  . Drug use: Never  . Sexual activity: Not on file  Lifestyle  . Physical activity    Days per week: Not on file    Minutes per session: Not on file  . Stress: Not on file  Relationships  . Social Herbalist on phone: Not on file   Gets together: Not on file    Attends religious service: Not on file    Active member of club or organization: Not on file    Attends meetings of clubs or organizations: Not on file    Relationship status: Not on file  . Intimate partner violence    Fear of current or ex partner: Not on file    Emotionally abused: Not on file    Physically abused: Not on file    Forced sexual activity: Not on file  Other Topics Concern  . Not on file  Social History Narrative  . Not on file    Outpatient Medications Prior to Visit  Medication Sig Dispense Refill  . acetaminophen (TYLENOL) 325 MG tablet Take 2 tablets (650 mg total) by mouth every 6 (six) hours as needed for mild pain.    . Ascorbic Acid (VITAMIN C) 1000 MG tablet Take 1,000 mg by mouth daily.    Marland Kitchen aspirin EC 81 MG tablet Take 81 mg by mouth every 4 (four) hours as needed for mild pain.    . B Complex-C (B-COMPLEX WITH VITAMIN C) tablet Take 1 tablet by mouth daily.    . Brimonidine Tartrate (LUMIFY) 0.025 % SOLN Apply 1 drop to eye every morning.    Marland Kitchen  cephALEXin (KEFLEX) 500 MG capsule Take 1 capsule (500 mg total) by mouth 3 (three) times daily. 15 capsule 0  . ibuprofen (MOTRIN IB) 200 MG tablet Take 2 tablets (400 mg total) by mouth every 6 (six) hours as needed for mild pain.    Vladimir Faster Glycol-Propyl Glycol (SYSTANE) 0.4-0.3 % SOLN Apply 1 drop to eye 2 (two) times daily.    . vitamin B-12 (CYANOCOBALAMIN) 1000 MCG tablet Take 1,000 mcg by mouth daily.    . vitamin E 100 UNIT capsule Take 100 Units by mouth daily.    . phenazopyridine (PYRIDIUM) 200 MG tablet Take 1 tablet (200 mg total) by mouth 3 (three) times daily as needed for pain. 15 tablet 0   No facility-administered medications prior to visit.     No Known Allergies  ROS Review of Systems  Constitutional: Negative for diaphoresis, fatigue, fever and unexpected weight change.  Respiratory: Negative.   Cardiovascular: Negative.   Gastrointestinal: Negative for  abdominal pain.  Genitourinary: Positive for dysuria, frequency, hematuria and urgency. Negative for flank pain, vaginal bleeding, vaginal discharge and vaginal pain.  Psychiatric/Behavioral: Negative.       Objective:    Physical Exam  Constitutional: She is oriented to person, place, and time. She appears well-developed and well-nourished. No distress.  HENT:  Head: Normocephalic and atraumatic.  Right Ear: External ear normal.  Left Ear: External ear normal.  Eyes: Conjunctivae are normal. Right eye exhibits no discharge. Left eye exhibits no discharge. No scleral icterus.  Neck: No JVD present. No tracheal deviation present.  Pulmonary/Chest: Effort normal. No stridor.  Neurological: She is alert and oriented to person, place, and time.  Skin: She is not diaphoretic.  Psychiatric: She has a normal mood and affect. Her behavior is normal.    There were no vitals taken for this visit. Wt Readings from Last 3 Encounters:  06/28/19 130 lb 8 oz (59.2 kg)  12/01/18 121 lb (54.9 kg)  04/14/18 130 lb 6 oz (59.1 kg)   BP Readings from Last 3 Encounters:  06/28/19 118/70  12/02/18 (!) 96/54  12/01/18 110/60   Guideline developer:  UpToDate (see UpToDate for funding source) Date Released: June 2014  Health Maintenance Due  Topic Date Due  . HIV Screening  09/16/1986  . TETANUS/TDAP  09/16/1990  . PAP SMEAR-Modifier  09/16/1992  . INFLUENZA VACCINE  07/17/2019    There are no preventive care reminders to display for this patient.  No results found for: TSH Lab Results  Component Value Date   WBC 7.2 06/28/2019   HGB 12.0 06/28/2019   HCT 36.3 06/28/2019   MCV 89.4 06/28/2019   PLT 301 06/28/2019   Lab Results  Component Value Date   NA 139 12/01/2018   K 4.1 12/01/2018   CO2 23 12/01/2018   GLUCOSE 109 (H) 12/01/2018   BUN 11 12/01/2018   CREATININE 0.65 12/01/2018   BILITOT 1.1 12/01/2018   ALKPHOS 30 (L) 12/01/2018   AST 18 12/01/2018   ALT 15 12/01/2018    PROT 6.8 12/01/2018   ALBUMIN 4.4 12/01/2018   CALCIUM 8.9 12/01/2018   ANIONGAP 8 12/01/2018   GFR 98.49 12/01/2018   No results found for: CHOL No results found for: HDL No results found for: LDLCALC No results found for: TRIG No results found for: CHOLHDL No results found for: HGBA1C    Assessment & Plan:   Problem List Items Addressed This Visit    None    Visit Diagnoses  Acute cystitis with hematuria    -  Primary   Relevant Medications   ciprofloxacin (CIPRO) 500 MG tablet   phenazopyridine (PYRIDIUM) 200 MG tablet   Other Relevant Orders   Urinalysis, Routine w reflex microscopic   Urine Culture      Meds ordered this encounter  Medications  . ciprofloxacin (CIPRO) 500 MG tablet    Sig: Take 1 tablet (500 mg total) by mouth 2 (two) times daily for 7 days.    Dispense:  14 tablet    Refill:  0  . phenazopyridine (PYRIDIUM) 200 MG tablet    Sig: Take 1 tablet (200 mg total) by mouth 3 (three) times daily as needed for pain.    Dispense:  10 tablet    Refill:  0    Follow-up: Return in about 1 week (around 11/24/2019), or if symptoms worsen or fail to improve.   Virtual Visit via Video Note  I connected with [name] on 11/17/19 at 10:00 AM EST by a video enabled telemedicine application and verified that I am speaking with the correct person using two identifiers.  Location: Patient: home alone Provider:   I discussed the limitations of evaluation and management by telemedicine and the availability of in person appointments. The patient expressed understanding and agreed to proceed.  History of Present Illness:    Observations/Objective:   Assessment and Plan:   Follow Up Instructions:    I discussed the assessment and treatment plan with the patient. The patient was provided an opportunity to ask questions and all were answered. The patient agreed with the plan and demonstrated an understanding of the instructions.   The patient was  advised to call back or seek an in-person evaluation if the symptoms worsen or if the condition fails to improve as anticipated.  I provided . 20 Minutes of non-face-to-face time during this encounter.   Libby Maw, MD

## 2019-11-18 ENCOUNTER — Other Ambulatory Visit: Payer: Commercial Managed Care - PPO

## 2019-11-19 LAB — URINE CULTURE
MICRO NUMBER:: 1155653
Result:: NO GROWTH
SPECIMEN QUALITY:: ADEQUATE

## 2019-12-29 ENCOUNTER — Other Ambulatory Visit: Payer: Self-pay

## 2019-12-30 ENCOUNTER — Ambulatory Visit: Payer: Commercial Managed Care - PPO | Admitting: Family Medicine

## 2019-12-30 ENCOUNTER — Encounter: Payer: Self-pay | Admitting: Family Medicine

## 2019-12-30 VITALS — BP 102/60 | HR 73 | Temp 97.5°F | Ht 65.0 in | Wt 136.4 lb

## 2019-12-30 DIAGNOSIS — R350 Frequency of micturition: Secondary | ICD-10-CM

## 2019-12-30 DIAGNOSIS — N3281 Overactive bladder: Secondary | ICD-10-CM | POA: Diagnosis not present

## 2019-12-30 DIAGNOSIS — N3289 Other specified disorders of bladder: Secondary | ICD-10-CM | POA: Insufficient documentation

## 2019-12-30 LAB — POCT URINALYSIS DIPSTICK
Bilirubin, UA: NEGATIVE
Blood, UA: NEGATIVE
Glucose, UA: NEGATIVE
Ketones, UA: NEGATIVE
Leukocytes, UA: NEGATIVE
Nitrite, UA: NEGATIVE
Protein, UA: NEGATIVE
Spec Grav, UA: 1.01 (ref 1.010–1.025)
Urobilinogen, UA: 0.2 E.U./dL
pH, UA: 6 (ref 5.0–8.0)

## 2019-12-30 MED ORDER — OXYBUTYNIN CHLORIDE 5 MG PO TABS: 5.0000 mg | ORAL_TABLET | Freq: Three times a day (TID) | ORAL | 0 refills | Status: DC | PRN

## 2019-12-30 NOTE — Progress Notes (Signed)
Established Patient Office Visit  Subjective:  Patient ID: Kayla Bowman, female    DOB: [birth year]  Age: 49 y.o. MRN: [id number]  CC:  Chief Complaint  Patient presents with  . Urinary Tract Infection    pt states that she has urine frequency and urgency to go all the time, no burning or discomfort.    HPI Kayla Bowman presents for evaluation treatment of ongoing urinary frequency with urgency.  She has had no dysuria, incomplete emptying, hematuria or vaginal discharge.  She is not sexually active.  Recent Pap smear back in October was normal.  Was seen for similar symptoms about a month ago with a normal UA and negative culture.  Point-of-care UA today is normal.  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  She is a very busy person and works many hours.  History reviewed. No pertinent past medical history.  Past Surgical History:  Procedure Laterality Date  . LAPAROSCOPIC APPENDECTOMY N/A 12/01/2018   Procedure: APPENDECTOMY LAPAROSCOPIC;  Surgeon: Alphonsa Overall, MD;  Location: [facility];  Service: General;  Laterality: N/A;    History reviewed. No pertinent family history.  Social History   Socioeconomic History  . Marital status: Married    Spouse name: Not on file  . Number of children: Not on file  . Years of education: Not on file  . Highest education level: Not on file  Occupational History  . Not on file  Tobacco Use  . Smoking status: Never Smoker  . Smokeless tobacco: Never Used  Substance and Sexual Activity  . Alcohol use: Not Currently  . Drug use: Never  . Sexual activity: Not on file  Other Topics Concern  . Not on file  Social History Narrative  . Not on file   Social Determinants of Health   Financial Resource Strain:   . Difficulty of Paying Living Expenses: Not on file  Food Insecurity:   . Worried About Charity fundraiser in the Last Year: Not on file  . Ran Out of Food in the Last Year: Not on  file  Transportation Needs:   . Lack of Transportation (Medical): Not on file  . Lack of Transportation (Non-Medical): Not on file  Physical Activity:   . Days of Exercise per Week: Not on file  . Minutes of Exercise per Session: Not on file  Stress:   . Feeling of Stress : Not on file  Social Connections:   . Frequency of Communication with Friends and Family: Not on file  . Frequency of Social Gatherings with Friends and Family: Not on file  . Attends Religious Services: Not on file  . Active Member of Clubs or Organizations: Not on file  . Attends Archivist Meetings: Not on file  . Marital Status: Not on file  Intimate Partner Violence:   . Fear of Current or Ex-Partner: Not on file  . Emotionally Abused: Not on file  . Physically Abused: Not on file  . Sexually Abused: Not on file    Outpatient Medications Prior to Visit  Medication Sig Dispense Refill  . Ascorbic Acid (VITAMIN C) 1000 MG tablet Take 1,000 mg by mouth daily.    . B Complex-C (B-COMPLEX WITH VITAMIN C) tablet Take 1 tablet by mouth daily.    . Brimonidine Tartrate (LUMIFY) 0.025 % SOLN Apply 1 drop to eye every morning.    Vladimir Faster Glycol-Propyl Glycol (SYSTANE) 0.4-0.3 % SOLN Apply  1 drop to eye 2 (two) times daily.    . vitamin B-12 (CYANOCOBALAMIN) 1000 MCG tablet Take 1,000 mcg by mouth daily.    . vitamin E 100 UNIT capsule Take 100 Units by mouth daily.    Marland Kitchen aspirin EC 81 MG tablet Take 81 mg by mouth every 4 (four) hours as needed for mild pain.    . cephALEXin (KEFLEX) 500 MG capsule Take 1 capsule (500 mg total) by mouth 3 (three) times daily. (Patient not taking: Reported on 12/30/2019) 15 capsule 0  . phenazopyridine (PYRIDIUM) 200 MG tablet Take 1 tablet (200 mg total) by mouth 3 (three) times daily as needed for pain. (Patient not taking: Reported on 12/30/2019) 10 tablet 0   No facility-administered medications prior to visit.    No Known Allergies  ROS Review of Systems    Constitutional: Negative.   Respiratory: Negative.   Cardiovascular: Negative.   Gastrointestinal: Negative for abdominal pain, nausea and vomiting.  Endocrine: Negative for polydipsia and polyphagia.  Genitourinary: Positive for frequency and urgency. Negative for difficulty urinating, dysuria and hematuria.  Musculoskeletal: Negative for back pain and myalgias.  Psychiatric/Behavioral: Negative.       Objective:    Physical Exam  Constitutional: She is oriented to person, place, and time. She appears well-developed and well-nourished. No distress.  HENT:  Head: Normocephalic and atraumatic.  Right Ear: External ear normal.  Left Ear: External ear normal.  Eyes: Conjunctivae are normal. Right eye exhibits no discharge. Left eye exhibits no discharge. No scleral icterus.  Neck: No JVD present. No tracheal deviation present.  Cardiovascular: Normal rate, regular rhythm and normal heart sounds.  Pulmonary/Chest: Effort normal and breath sounds normal. No stridor.  Abdominal: Bowel sounds are normal. She exhibits no distension. There is no abdominal tenderness. There is no rebound and no CVA tenderness.  Neurological: She is alert and oriented to person, place, and time.  Skin: She is not diaphoretic.  Psychiatric: She has a normal mood and affect. Her behavior is normal.    BP 102/60   Pulse 73   Temp (!) 97.5 F (36.4 C) (Tympanic)   Ht 5\' 5"  (1.651 m)   Wt 136 lb 6.4 oz (61.9 kg)   SpO2 100%   BMI 22.70 kg/m  Wt Readings from Last 3 Encounters:  12/30/19 136 lb 6.4 oz (61.9 kg)  06/28/19 130 lb 8 oz (59.2 kg)  12/01/18 121 lb (54.9 kg)     Health Maintenance Due  Topic Date Due  . HIV Screening  09/16/1986  . TETANUS/TDAP  09/16/1990  . PAP SMEAR-Modifier  09/16/1992  . INFLUENZA VACCINE  07/17/2019    There are no preventive care reminders to display for this patient.  No results found for: TSH Lab Results  Component Value Date   WBC 7.2 06/28/2019   HGB  12.0 06/28/2019   HCT 36.3 06/28/2019   MCV 89.4 06/28/2019   PLT 301 06/28/2019   Lab Results  Component Value Date   NA 139 12/01/2018   K 4.1 12/01/2018   CO2 23 12/01/2018   GLUCOSE 109 (H) 12/01/2018   BUN 11 12/01/2018   CREATININE 0.65 12/01/2018   BILITOT 1.1 12/01/2018   ALKPHOS 30 (L) 12/01/2018   AST 18 12/01/2018   ALT 15 12/01/2018   PROT 6.8 12/01/2018   ALBUMIN 4.4 12/01/2018   CALCIUM 8.9 12/01/2018   ANIONGAP 8 12/01/2018   GFR 98.49 12/01/2018   No results found for: CHOL No results found  for: HDL No results found for: LDLCALC No results found for: TRIG No results found for: CHOLHDL No results found for: HGBA1C    Assessment & Plan:   Problem List Items Addressed This Visit      Genitourinary   Overactive bladder   Relevant Medications   oxybutynin (DITROPAN) 5 MG tablet   Other Relevant Orders   POCT Urinalysis Dipstick (Completed)     Other   Irritable bladder - Primary   Relevant Medications   oxybutynin (DITROPAN) 5 MG tablet   Urine frequency      Meds ordered this encounter  Medications  . oxybutynin (DITROPAN) 5 MG tablet    Sig: Take 1 tablet (5 mg total) by mouth every 8 (eight) hours as needed for bladder spasms.    Dispense:  60 tablet    Refill:  0    Follow-up: Return in about 4 weeks (around 01/27/2020).  Patient was given information on overactive bladder and advised to perform Kegel exercises.  She was also given a handout on bladder training.  She was given information on oxybutynin.  Follow-up up in 4 to 6 weeks.  Libby Maw, MD

## 2019-12-30 NOTE — Patient Instructions (Addendum)
Overactive Bladder, Adult  Overactive bladder refers to a condition in which a person has a sudden need to pass urine. The person may leak urine if he or she cannot get to the bathroom fast enough (urinary incontinence). A person with this condition may also wake up several times in the night to go to the bathroom. Overactive bladder is associated with poor nerve signals between your bladder and your brain. Your bladder may get the signal to empty before it is full. You may also have very sensitive muscles that make your bladder squeeze too soon. These symptoms might interfere with daily work or social activities. What are the causes? This condition may be associated with or caused by:  Urinary tract infection.  Infection of nearby tissues, such as the prostate.  Prostate enlargement.  Surgery on the uterus or urethra.  Bladder stones, inflammation, or tumors.  Drinking too much caffeine or alcohol.  Certain medicines, especially medicines that get rid of extra fluid in the body (diuretics).  Muscle or nerve weakness, especially from: ? A spinal cord injury. ? Stroke. ? Multiple sclerosis. ? Parkinson's disease.  Diabetes.  Constipation. What increases the risk? You may be at greater risk for overactive bladder if you:  Are an older adult.  Smoke.  Are going through menopause.  Have prostate problems.  Have a neurological disease, such as stroke, dementia, Parkinson's disease, or multiple sclerosis (MS).  Eat or drink things that irritate the bladder. These include alcohol, spicy food, and caffeine.  Are overweight or obese. What are the signs or symptoms? Symptoms of this condition include:  Sudden, strong urge to urinate.  Leaking urine.  Urinating 8 or more times a day.  Waking up to urinate 2 or more times a night. How is this diagnosed? Your health care provider may suspect overactive bladder based on your symptoms. He or she will diagnose this condition  by:  A physical exam and medical history.  Blood or urine tests. You might need bladder or urine tests to help determine what is causing your overactive bladder. You might also need to see a health care provider who specializes in urinary tract problems (urologist). How is this treated? Treatment for overactive bladder depends on the cause of your condition and whether it is mild or severe. You can also make lifestyle changes at home. Options include:  Bladder training. This may include: ? Learning to control the urge to urinate by following a schedule that directs you to urinate at regular intervals (timed voiding). ? Doing Kegel exercises to strengthen your pelvic floor muscles, which support your bladder. Toning these muscles can help you control urination, even if your bladder muscles are overactive.  Special devices. This may include: ? Biofeedback, which uses sensors to help you become aware of your body's signals. ? Electrical stimulation, which uses electrodes placed inside the body (implanted) or outside the body. These electrodes send gentle pulses of electricity to strengthen the nerves or muscles that control the bladder. ? Women may use a plastic device that fits into the vagina and supports the bladder (pessary).  Medicines. ? Antibiotics to treat bladder infection. ? Antispasmodics to stop the bladder from releasing urine at the wrong time. ? Tricyclic antidepressants to relax bladder muscles. ? Injections of botulinum toxin type A directly into the bladder tissue to relax bladder muscles.  Lifestyle changes. This may include: ? Weight loss. Talk to your health care provider about weight loss methods that would work best for you. ?   Diet changes. This may include reducing how much alcohol and caffeine you consume, or drinking fluids at different times of the day. ? Not smoking. Do not use any products that contain nicotine or tobacco, such as cigarettes and e-cigarettes. If  you need help quitting, ask your health care provider.  Surgery. ? A device may be implanted to help manage the nerve signals that control urination. ? An electrode may be implanted to stimulate electrical signals in the bladder. ? A procedure may be done to change the shape of the bladder. This is done only in very severe cases. Follow these instructions at home: Lifestyle  Make any diet or lifestyle changes that are recommended by your health care provider. These may include: ? Drinking less fluid or drinking fluids at different times of the day. ? Cutting down on caffeine or alcohol. ? Doing Kegel exercises. ? Losing weight if needed. ? Eating a healthy and balanced diet to prevent constipation. This may include:  Eating foods that are high in fiber, such as fresh fruits and vegetables, whole grains, and beans.  Limiting foods that are high in fat and processed sugars, such as fried and sweet foods. General instructions  Take over-the-counter and prescription medicines only as told by your health care provider.  If you were prescribed an antibiotic medicine, take it as told by your health care provider. Do not stop taking the antibiotic even if you start to feel better.  Use any implants or pessary as told by your health care provider.  If needed, wear pads to absorb urine leakage.  Keep a journal or log to track how much and when you drink and when you feel the need to urinate. This will help your health care provider monitor your condition.  Keep all follow-up visits as told by your health care provider. This is important. Contact a health care provider if:  You have a fever.  Your symptoms do not get better with treatment.  Your pain and discomfort get worse.  You have more frequent urges to urinate. Get help right away if:  You are not able to control your bladder. Summary  Overactive bladder refers to a condition in which a person has a sudden need to pass  urine.  Several conditions may lead to an overactive bladder.  Treatment for overactive bladder depends on the cause and severity of your condition.  Follow your health care provider's instructions about lifestyle changes, doing Kegel exercises, keeping a journal, and taking medicines. This information is not intended to replace advice given to you by your health care provider. Make sure you discuss any questions you have with your health care provider. Document Revised: 03/25/2019 Document Reviewed: 12/18/2017 Elsevier Patient Education  [place]. Oxybutynin tablets What is this medicine? OXYBUTYNIN (ox i BYOO ti nin) is used to treat overactive bladder. This medicine reduces the amount of bathroom visits. It may also help to control wetting accidents. This medicine may be used for other purposes; ask your health care provider or pharmacist if you have questions. COMMON BRAND NAME(S): Ditropan What should I tell my health care provider before I take this medicine? They need to know if you have any of these conditions:  autonomic neuropathy  dementia  difficulty passing urine  glaucoma  intestinal obstruction  kidney disease  liver disease  myasthenia gravis  Parkinson's disease  an unusual or allergic reaction to oxybutynin, other medicines, foods, dyes, or preservatives  pregnant or trying to get pregnant  breast-feeding How should I use this medicine? Take this medicine by mouth with a glass of water. Follow the directions on the prescription label. You can take this medicine with or without food. Take your medicine at regular intervals. Do not take your medicine more often than directed. Talk to your pediatrician regarding the use of this medicine in children. Special care may be needed. While this drug may be prescribed for children as young as 5 years for selected conditions, precautions do apply. Overdosage: If you think you have taken too much of this  medicine contact a poison control center or emergency room at once. NOTE: This medicine is only for you. Do not share this medicine with others. What if I miss a dose? If you miss a dose, take it as soon as you can. If it is almost time for your next dose, take only that dose. Do not take double or extra doses. What may interact with this medicine?  antihistamines for allergy, cough and cold  atropine  certain medicines for bladder problems like oxybutynin, tolterodine  certain medicines for Parkinson's disease like benztropine, trihexyphenidyl  certain medicines for stomach problems like dicyclomine, hyoscyamine  certain medicines for travel sickness like scopolamine  clarithromycin  erythromycin  ipratropium  medicines for fungal infections, like fluconazole, itraconazole, ketoconazole or voriconazole This list may not describe all possible interactions. Give your health care provider a list of all the medicines, herbs, non-prescription drugs, or dietary supplements you use. Also tell them if you smoke, drink alcohol, or use illegal drugs. Some items may interact with your medicine. What should I watch for while using this medicine? It may take a few weeks to notice the full benefit from this medicine. You may need to limit your intake tea, coffee, caffeinated sodas, and alcohol. These drinks may make your symptoms worse. You may get drowsy or dizzy. Do not drive, use machinery, or do anything that needs mental alertness until you know how this medicine affects you. Do not stand or sit up quickly, especially if you are an older patient. This reduces the risk of dizzy or fainting spells. Alcohol may interfere with the effect of this medicine. Avoid alcoholic drinks. Your mouth may get dry. Chewing sugarless gum or sucking hard candy, and drinking plenty of water may help. Contact your doctor if the problem does not go away or is severe. This medicine may cause dry eyes and blurred  vision. If you wear contact lenses, you may feel some discomfort. Lubricating drops may help. See your eyecare professional if the problem does not go away or is severe. Avoid extreme heat. This medicine can cause you to sweat less than normal. Your body temperature could increase to dangerous levels, which may lead to heat stroke. What side effects may I notice from receiving this medicine? Side effects that you should report to your doctor or health care professional as soon as possible:  allergic reactions like skin rash, itching or hives, swelling of the face, lips, or tongue  agitation  breathing problems  confusion  fever  flushing (reddening of the skin)  hallucinations  memory loss  pain or difficulty passing urine  palpitations  unusually weak or tired Side effects that usually do not require medical attention (report to your doctor or health care professional if they continue or are bothersome):  constipation  headache  sexual difficulties (impotence) This list may not describe all possible side effects. Call your doctor for medical advice about side effects. You may  report side effects to FDA at 1-800-FDA-1088. Where should I keep my medicine? Keep out of the reach of children. Store at room temperature between 15 and 30 degrees C (59 and 86 degrees F). Protect from moisture and humidity. Throw away any unused medicine after the expiration date. NOTE: This sheet is a summary. It may not cover all possible information. If you have questions about this medicine, talk to your doctor, pharmacist, or health care provider.  2020 Elsevier/Gold Standard (2014-02-17 10:57:08)

## 2020-07-05 ENCOUNTER — Other Ambulatory Visit: Payer: Self-pay

## 2020-07-06 ENCOUNTER — Ambulatory Visit (INDEPENDENT_AMBULATORY_CARE_PROVIDER_SITE_OTHER): Payer: 59 | Admitting: Family Medicine

## 2020-07-06 ENCOUNTER — Encounter: Payer: Self-pay | Admitting: Family Medicine

## 2020-07-06 VITALS — BP 114/70 | HR 66 | Temp 99.0°F | Ht 65.0 in | Wt 136.4 lb

## 2020-07-06 DIAGNOSIS — Z0000 Encounter for general adult medical examination without abnormal findings: Secondary | ICD-10-CM

## 2020-07-06 NOTE — Patient Instructions (Signed)
Health Maintenance, Female Adopting a healthy lifestyle and getting preventive care are important in promoting health and wellness. Ask your health care provider about:  The right schedule for you to have regular tests and exams.  Things you can do on your own to prevent diseases and keep yourself healthy. What should I know about diet, weight, and exercise? Eat a healthy diet   Eat a diet that includes plenty of vegetables, fruits, low-fat dairy products, and lean protein.  Do not eat a lot of foods that are high in solid fats, added sugars, or sodium. Maintain a healthy weight Body mass index (BMI) is used to identify weight problems. It estimates body fat based on height and weight. Your health care provider can help determine your BMI and help you achieve or maintain a healthy weight. Get regular exercise Get regular exercise. This is one of the most important things you can do for your health. Most adults should:  Exercise for at least 150 minutes each week. The exercise should increase your heart rate and make you sweat (moderate-intensity exercise).  Do strengthening exercises at least twice a week. This is in addition to the moderate-intensity exercise.  Spend less time sitting. Even light physical activity can be beneficial. Watch cholesterol and blood lipids Have your blood tested for lipids and cholesterol at 49 years of age, then have this test every 5 years. Have your cholesterol levels checked more often if:  Your lipid or cholesterol levels are high.  You are older than 49 years of age.  You are at high risk for heart disease. What should I know about cancer screening? Depending on your health history and family history, you may need to have cancer screening at various ages. This may include screening for:  Breast cancer.  Cervical cancer.  Colorectal cancer.  Skin cancer.  Lung cancer. What should I know about heart disease, diabetes, and high blood  pressure? Blood pressure and heart disease  High blood pressure causes heart disease and increases the risk of stroke. This is more likely to develop in people who have high blood pressure readings, are of African descent, or are overweight.  Have your blood pressure checked: ? Every 3-5 years if you are 54-62 years of age. ? Every year if you are 93 years old or older. Diabetes Have regular diabetes screenings. This checks your fasting blood sugar level. Have the screening done:  Once every three years after age 69 if you are at a normal weight and have a low risk for diabetes.  More often and at a younger age if you are overweight or have a high risk for diabetes. What should I know about preventing infection? Hepatitis B If you have a higher risk for hepatitis B, you should be screened for this virus. Talk with your health care provider to find out if you are at risk for hepatitis B infection. Hepatitis C Testing is recommended for:  Everyone born from [birth year] through 1965.  Anyone with known risk factors for hepatitis C. Sexually transmitted infections (STIs)  Get screened for STIs, including gonorrhea and chlamydia, if: ? You are sexually active and are younger than 49 years of age. ? You are older than 49 years of age and your health care provider tells you that you are at risk for this type of infection. ? Your sexual activity has changed since you were last screened, and you are at increased risk for chlamydia or gonorrhea. Ask your health care provider if  you are at risk. °· Ask your health care provider about whether you are at high risk for HIV. Your health care provider may recommend a prescription medicine to help prevent HIV infection. If you choose to take medicine to prevent HIV, you should first get tested for HIV. You should then be tested every 3 months for as long as you are taking the medicine. °Pregnancy °· If you are about to stop having your period (premenopausal) and  you may become pregnant, seek counseling before you get pregnant. °· Take 400 to 800 micrograms (mcg) of folic acid every day if you become pregnant. °· Ask for birth control (contraception) if you want to prevent pregnancy. °Osteoporosis and menopause °Osteoporosis is a disease in which the bones lose minerals and strength with aging. This can result in bone fractures. If you are 65 years old or older, or if you are at risk for osteoporosis and fractures, ask your health care provider if you should: °· Be screened for bone loss. °· Take a calcium or vitamin D supplement to lower your risk of fractures. °· Be given hormone replacement therapy (HRT) to treat symptoms of menopause. °Follow these instructions at home: °Lifestyle °· Do not use any products that contain nicotine or tobacco, such as cigarettes, e-cigarettes, and chewing tobacco. If you need help quitting, ask your health care provider. °· Do not use street drugs. °· Do not share needles. °· Ask your health care provider for help if you need support or information about quitting drugs. °Alcohol use °· Do not drink alcohol if: °? Your health care provider tells you not to drink. °? You are pregnant, may be pregnant, or are planning to become pregnant. °· If you drink alcohol: °? Limit how much you use to 0-1 drink a day. °? Limit intake if you are breastfeeding. °· Be aware of how much alcohol is in your drink. In the [place] one drink equals one 12 oz bottle of beer (355 mL), one 5 oz glass of wine (148 mL), or one 1½ oz glass of hard liquor (44 mL). °General instructions °· Schedule regular health, dental, and eye exams. °· Stay current with your vaccines. °· Tell your health care provider if: °? You often feel depressed. °? You have ever been abused or do not feel safe at home. °Summary °· Adopting a healthy lifestyle and getting preventive care are important in promoting health and wellness. °· Follow your health care provider's instructions about healthy  diet, exercising, and getting tested or screened for diseases. °· Follow your health care provider's instructions on monitoring your cholesterol and blood pressure. °This information is not intended to replace advice given to you by your health care provider. Make sure you discuss any questions you have with your health care provider. °Document Revised: 11/25/2018 Document Reviewed: 11/25/2018 °Elsevier Patient Education © 2020 Elsevier Inc. ° °Preventive Care 40-64 Years Old, Female °Preventive care refers to visits with your health care provider and lifestyle choices that can promote health and wellness. This includes: °· A yearly physical exam. This may also be called an annual well check. °· Regular dental visits and eye exams. °· Immunizations. °· Screening for certain conditions. °· Healthy lifestyle choices, such as eating a healthy diet, getting regular exercise, not using drugs or products that contain nicotine and tobacco, and limiting alcohol use. °What can I expect for my preventive care visit? °Physical exam °Your health care provider will check your: °· Height and weight. This may be used   to calculate body mass index (BMI), which tells if you are at a healthy weight. °· Heart rate and blood pressure. °· Skin for abnormal spots. °Counseling °Your health care provider may ask you questions about your: °· Alcohol, tobacco, and drug use. °· Emotional well-being. °· Home and relationship well-being. °· Sexual activity. °· Eating habits. °· Work and work environment. °· Method of birth control. °· Menstrual cycle. °· Pregnancy history. °What immunizations do I need? ° °Influenza (flu) vaccine °· This is recommended every year. °Tetanus, diphtheria, and pertussis (Tdap) vaccine °· You may need a Td booster every 10 years. °Varicella (chickenpox) vaccine °· You may need this if you have not been vaccinated. °Zoster (shingles) vaccine °· You may need this after age 60. °Measles, mumps, and rubella (MMR)  vaccine °· You may need at least one dose of MMR if you were born in 1957 or later. You may also need a second dose. °Pneumococcal conjugate (PCV13) vaccine °· You may need this if you have certain conditions and were not previously vaccinated. °Pneumococcal polysaccharide (PPSV23) vaccine °· You may need one or two doses if you smoke cigarettes or if you have certain conditions. °Meningococcal conjugate (MenACWY) vaccine °· You may need this if you have certain conditions. °Hepatitis A vaccine °· You may need this if you have certain conditions or if you travel or work in places where you may be exposed to hepatitis A. °Hepatitis B vaccine °· You may need this if you have certain conditions or if you travel or work in places where you may be exposed to hepatitis B. °Haemophilus influenzae type b (Hib) vaccine °· You may need this if you have certain conditions. °Human papillomavirus (HPV) vaccine °· If recommended by your health care provider, you may need three doses over 6 months. °You may receive vaccines as individual doses or as more than one vaccine together in one shot (combination vaccines). Talk with your health care provider about the risks and benefits of combination vaccines. °What tests do I need? °Blood tests °· Lipid and cholesterol levels. These may be checked every 5 years, or more frequently if you are over 50 years old. °· Hepatitis C test. °· Hepatitis B test. °Screening °· Lung cancer screening. You may have this screening every year starting at age 55 if you have a 30-pack-year history of smoking and currently smoke or have quit within the past 15 years. °· Colorectal cancer screening. All adults should have this screening starting at age 50 and continuing until age 75. Your health care provider may recommend screening at age 45 if you are at increased risk. You will have tests every 1-10 years, depending on your results and the type of screening test. °· Diabetes screening. This is done by  checking your blood sugar (glucose) after you have not eaten for a while (fasting). You may have this done every 1-3 years. °· Mammogram. This may be done every 1-2 years. Talk with your health care provider about when you should start having regular mammograms. This may depend on whether you have a family history of breast cancer. °· BRCA-related cancer screening. This may be done if you have a family history of breast, ovarian, tubal, or peritoneal cancers. °· Pelvic exam and Pap test. This may be done every 3 years starting at age 21. Starting at age 30, this may be done every 5 years if you have a Pap test in combination with an HPV test. °Other tests °· Sexually transmitted disease (  STD) testing. °· Bone density scan. This is done to screen for osteoporosis. You may have this scan if you are at high risk for osteoporosis. °Follow these instructions at home: °Eating and drinking °· Eat a diet that includes fresh fruits and vegetables, whole grains, lean protein, and low-fat dairy. °· Take vitamin and mineral supplements as recommended by your health care provider. °· Do not drink alcohol if: °? Your health care provider tells you not to drink. °? You are pregnant, may be pregnant, or are planning to become pregnant. °· If you drink alcohol: °? Limit how much you have to 0-1 drink a day. °? Be aware of how much alcohol is in your drink. In the [place] one drink equals one 12 oz bottle of beer (355 mL), one 5 oz glass of wine (148 mL), or one 1½ oz glass of hard liquor (44 mL). °Lifestyle °· Take daily care of your teeth and gums. °· Stay active. Exercise for at least 30 minutes on 5 or more days each week. °· Do not use any products that contain nicotine or tobacco, such as cigarettes, e-cigarettes, and chewing tobacco. If you need help quitting, ask your health care provider. °· If you are sexually active, practice safe sex. Use a condom or other form of birth control (contraception) in order to prevent pregnancy  and STIs (sexually transmitted infections). °· If told by your health care provider, take low-dose aspirin daily starting at age 50. °What's next? °· Visit your health care provider once a year for a well check visit. °· Ask your health care provider how often you should have your eyes and teeth checked. °· Stay up to date on all vaccines. °This information is not intended to replace advice given to you by your health care provider. Make sure you discuss any questions you have with your health care provider. °Document Revised: 08/13/2018 Document Reviewed: 08/13/2018 °Elsevier Patient Education © 2020 [employer] Inc. ° °

## 2020-07-06 NOTE — Progress Notes (Signed)
New Patient Office Visit  Subjective:  Patient ID: Kayla Bowman, female    DOB: [birth year]  Age: 49 y.o. MRN: [id number]  CC: No chief complaint on file.   HPI Kayla Bowman presents for a physical exam.  She has been doing well.  Quite busy at work.  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  She stays quite active on her job.  She does not smoke drink alcohol or use illicit drugs.  She is married.  Totally recovered from a laparoscopic appendectomy back in December of this past year.  History reviewed. No pertinent past medical history.  Past Surgical History:  Procedure Laterality Date  . LAPAROSCOPIC APPENDECTOMY N/A 12/01/2018   Procedure: APPENDECTOMY LAPAROSCOPIC;  Surgeon: Alphonsa Overall, MD;  Location: [facility];  Service: General;  Laterality: N/A;    History reviewed. No pertinent family history.  Social History   Socioeconomic History  . Marital status: Married    Spouse name: Not on file  . Number of children: Not on file  . Years of education: Not on file  . Highest education level: Not on file  Occupational History  . Not on file  Tobacco Use  . Smoking status: Never Smoker  . Smokeless tobacco: Never Used  Substance and Sexual Activity  . Alcohol use: Not Currently  . Drug use: Never  . Sexual activity: Not on file  Other Topics Concern  . Not on file  Social History Narrative  . Not on file   Social Determinants of Health   Financial Resource Strain:   . Difficulty of Paying Living Expenses:   Food Insecurity:   . Worried About Charity fundraiser in the Last Year:   . Arboriculturist in the Last Year:   Transportation Needs:   . Film/video editor (Medical):   Marland Kitchen Lack of Transportation (Non-Medical):   Physical Activity:   . Days of Exercise per Week:   . Minutes of Exercise per Session:   Stress:   . Feeling of Stress :   Social Connections:   . Frequency of Communication with Friends  and Family:   . Frequency of Social Gatherings with Friends and Family:   . Attends Religious Services:   . Active Member of Clubs or Organizations:   . Attends Archivist Meetings:   Marland Kitchen Marital Status:   Intimate Partner Violence:   . Fear of Current or Ex-Partner:   . Emotionally Abused:   Marland Kitchen Physically Abused:   . Sexually Abused:     ROS Review of Systems  Constitutional: Negative.   HENT: Negative.   Eyes: Negative for photophobia and visual disturbance.  Respiratory: Negative.   Cardiovascular: Negative.   Gastrointestinal: Negative.   Endocrine: Negative for polyphagia and polyuria.  Genitourinary: Negative.   Musculoskeletal: Negative for gait problem and joint swelling.  Skin: Negative for pallor and rash.  Allergic/Immunologic: Negative for immunocompromised state.  Neurological: Negative for weakness and numbness.  Hematological: Does not bruise/bleed easily.  Psychiatric/Behavioral: Negative.     Objective:   Today's Vitals: BP 114/70 (BP Location: Left Arm, Patient Position: Sitting, Cuff Size: Normal)   Pulse 66   Temp 99 F (37.2 C) (Oral)   Ht 5\' 5"  (1.651 m)   Wt 136 lb 6 oz (61.9 kg)   LMP 06/19/2020 (Exact Date)   SpO2 99%   BMI 22.69 kg/m   Physical Exam Vitals and nursing note reviewed.  Constitutional:  General: She is not in acute distress.    Appearance: Normal appearance. She is normal weight. She is not ill-appearing, toxic-appearing or diaphoretic.  HENT:     Head: Normocephalic and atraumatic.     Right Ear: Tympanic membrane, ear canal and external ear normal.     Left Ear: Tympanic membrane, ear canal and external ear normal.     Mouth/Throat:     Mouth: Mucous membranes are moist.     Pharynx: Oropharynx is clear. No oropharyngeal exudate or posterior oropharyngeal erythema.  Eyes:     General: No scleral icterus.       Right eye: No discharge.        Left eye: No discharge.     Extraocular Movements: Extraocular  movements intact.     Conjunctiva/sclera: Conjunctivae normal.     Pupils: Pupils are equal, round, and reactive to light.  Cardiovascular:     Rate and Rhythm: Normal rate and regular rhythm.  Pulmonary:     Effort: Pulmonary effort is normal.     Breath sounds: Normal breath sounds.  Abdominal:     General: Bowel sounds are normal.  Musculoskeletal:        General: No swelling, tenderness, deformity or signs of injury.     Cervical back: Normal range of motion and neck supple. No rigidity or tenderness.     Right lower leg: No edema.     Left lower leg: No edema.  Lymphadenopathy:     Cervical: No cervical adenopathy.  Skin:    General: Skin is warm and dry.  Neurological:     Mental Status: She is alert and oriented to person, place, and time.     Motor: No weakness.     Coordination: Romberg sign negative. Coordination normal.  Psychiatric:        Mood and Affect: Mood normal.        Behavior: Behavior normal.     Assessment & Plan:   Problem List Items Addressed This Visit    None    Visit Diagnoses    Healthcare maintenance    -  Primary   Relevant Orders   CBC   Comprehensive metabolic panel   LDL cholesterol, direct   Lipid panel   TSH   Urinalysis, Routine w reflex microscopic   VITAMIN D 25 Hydroxy (Vit-D Deficiency, Fractures)      Outpatient Encounter Medications as of 07/06/2020  Medication Sig  . Ascorbic Acid (VITAMIN C) 1000 MG tablet Take 1,000 mg by mouth daily.  . B Complex-C (B-COMPLEX WITH VITAMIN C) tablet Take 1 tablet by mouth daily.  . Brimonidine Tartrate (LUMIFY) 0.025 % SOLN Apply 1 drop to eye every morning.  Vladimir Faster Glycol-Propyl Glycol (SYSTANE) 0.4-0.3 % SOLN Apply 1 drop to eye 2 (two) times daily.  . vitamin B-12 (CYANOCOBALAMIN) 1000 MCG tablet Take 1,000 mcg by mouth daily.  . vitamin E 100 UNIT capsule Take 100 Units by mouth daily.  . [DISCONTINUED] aspirin EC 81 MG tablet Take 81 mg by mouth every 4 (four) hours as  needed for mild pain.  . [DISCONTINUED] cephALEXin (KEFLEX) 500 MG capsule Take 1 capsule (500 mg total) by mouth 3 (three) times daily. (Patient not taking: Reported on 12/30/2019)  . [DISCONTINUED] oxybutynin (DITROPAN) 5 MG tablet Take 1 tablet (5 mg total) by mouth every 8 (eight) hours as needed for bladder spasms.  . [DISCONTINUED] phenazopyridine (PYRIDIUM) 200 MG tablet Take 1 tablet (200 mg total) by mouth 3 (  three) times daily as needed for pain. (Patient not taking: Reported on 12/30/2019)   No facility-administered encounter medications on file as of 07/06/2020.    Follow-up: Return in about 1 year (around 07/06/2021), or if symptoms worsen or fail to improve.   Libby Maw, MD

## 2020-07-07 LAB — LDL CHOLESTEROL, DIRECT: Direct LDL: 109 mg/dL

## 2020-07-07 LAB — COMPREHENSIVE METABOLIC PANEL
ALT: 11 U/L (ref 0–35)
AST: 16 U/L (ref 0–37)
Albumin: 4.3 g/dL (ref 3.5–5.2)
Alkaline Phosphatase: 40 U/L (ref 39–117)
BUN: 9 mg/dL (ref 6–23)
CO2: 25 mEq/L (ref 19–32)
Calcium: 9.2 mg/dL (ref 8.4–10.5)
Chloride: 103 mEq/L (ref 96–112)
Creatinine, Ser: 0.78 mg/dL (ref 0.40–1.20)
GFR: 78.56 mL/min (ref >60.00)
Glucose, Bld: 86 mg/dL (ref 70–99)
Potassium: 4.4 mEq/L (ref 3.5–5.1)
Sodium: 135 mEq/L (ref 135–145)
Total Bilirubin: 1 mg/dL (ref 0.2–1.2)
Total Protein: 6.8 g/dL (ref 6.0–8.3)

## 2020-07-07 LAB — URINALYSIS, ROUTINE W REFLEX MICROSCOPIC
Bilirubin Urine: NEGATIVE
Hgb urine dipstick: NEGATIVE
Leukocytes,Ua: NEGATIVE
Nitrite: NEGATIVE
Specific Gravity, Urine: <=1.005 — AB (ref 1.000–1.030)
Total Protein, Urine: NEGATIVE
Urine Glucose: NEGATIVE
Urobilinogen, UA: 0.2 (ref 0.0–1.0)
pH: 5.5 (ref 5.0–8.0)

## 2020-07-07 LAB — CBC
HCT: 41.2 % (ref 36.0–46.0)
Hemoglobin: 13.6 g/dL (ref 12.0–15.0)
MCHC: 32.9 g/dL (ref 30.0–36.0)
MCV: 92.3 fl (ref 78.0–100.0)
Platelets: 324 10*3/uL (ref 150.0–400.0)
RBC: 4.47 Mil/uL (ref 3.87–5.11)
RDW: 14.3 % (ref 11.5–15.5)
WBC: 6.6 10*3/uL (ref 4.0–10.5)

## 2020-07-07 LAB — LIPID PANEL
Cholesterol: 216 mg/dL — ABNORMAL HIGH (ref 0–200)
HDL: 88.6 mg/dL (ref >39.00)
LDL Cholesterol: 120 mg/dL — ABNORMAL HIGH (ref 0–99)
NonHDL: 127.68
Total CHOL/HDL Ratio: 2
Triglycerides: 40 mg/dL (ref 0.0–149.0)
VLDL: 8 mg/dL (ref 0.0–40.0)

## 2020-07-07 LAB — VITAMIN D 25 HYDROXY (VIT D DEFICIENCY, FRACTURES): VITD: 28.15 ng/mL — ABNORMAL LOW (ref 30.00–100.00)

## 2020-07-07 LAB — TSH: TSH: 1.22 u[IU]/mL (ref 0.35–4.50)

## 2020-09-26 ENCOUNTER — Other Ambulatory Visit: Payer: Self-pay

## 2020-09-27 ENCOUNTER — Ambulatory Visit: Payer: 59 | Admitting: Nurse Practitioner

## 2020-10-27 ENCOUNTER — Other Ambulatory Visit: Payer: Self-pay | Admitting: Obstetrics and Gynecology

## 2020-10-27 DIAGNOSIS — R928 Other abnormal and inconclusive findings on diagnostic imaging of breast: Secondary | ICD-10-CM

## 2020-10-31 ENCOUNTER — Ambulatory Visit
Admission: RE | Admit: 2020-10-31 | Discharge: 2020-10-31 | Disposition: A | Discharge status: Home / Self Care | Payer: 59 | Source: Ambulatory Visit | Attending: Obstetrics and Gynecology | Admitting: Obstetrics and Gynecology

## 2020-10-31 ENCOUNTER — Other Ambulatory Visit: Payer: Self-pay

## 2020-10-31 DIAGNOSIS — R928 Other abnormal and inconclusive findings on diagnostic imaging of breast: Secondary | ICD-10-CM

## 2020-12-21 ENCOUNTER — Telehealth: Payer: Self-pay

## 2020-12-21 NOTE — Telephone Encounter (Signed)
Boneta Lucks calling back from [facility]. Please advise. [phone]

## 2020-12-22 NOTE — Telephone Encounter (Signed)
Spoke with Kayla Bowman who states that she faxed over forms a few weeks ago but was told she had birthday incorrect. Forms corrected and re-faxed last week but she have not received anything from [place]. Per Kayla Bowman she is working virtually today but will fax forms again on Monday to have them signed and sent back.

## 2020-12-27 NOTE — Telephone Encounter (Signed)
Received a request for recent face to face notes for pt by [facility].  Faxed over last OV 07/06/20 to [phone]. Dm/cma

## 2021-01-07 IMAGING — MG MM DIGITAL DIAGNOSTIC UNILAT*L* W/ TOMO W/ CAD
4 series · 4 of 12 positions shown · non-contrast
Comparison: Previous exam(s).

CLINICAL DATA: The patient was called back for 2 possible left
breast masses.

EXAM:
DIGITAL DIAGNOSTIC LEFT MAMMOGRAM WITH TOMO
ULTRASOUND LEFT BREAST

[L CC synth-2D]
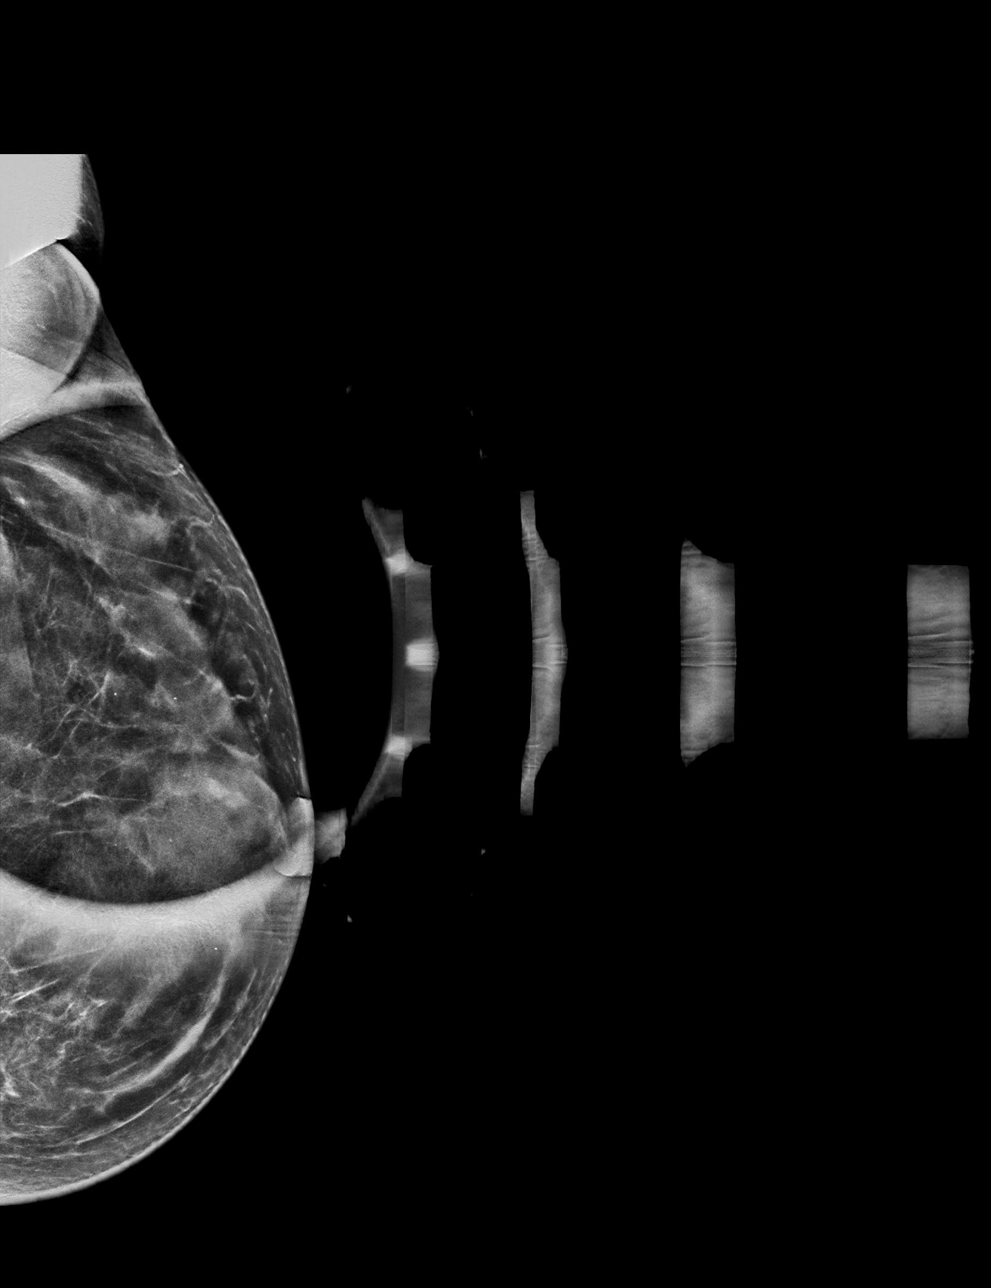

[L ML synth-2D]
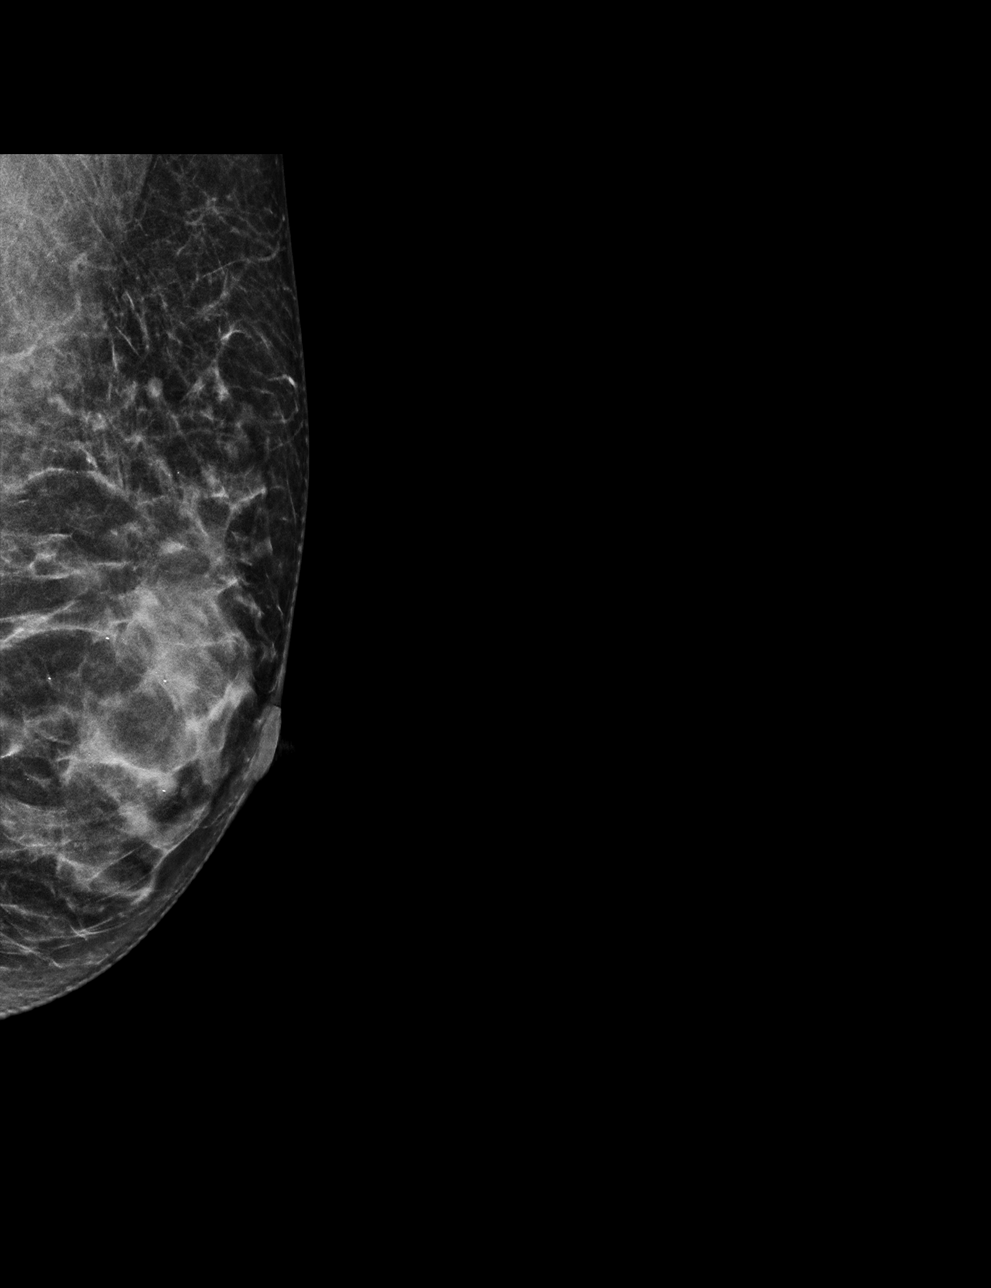

[L ML tomo · tomo slice 26/51.0]
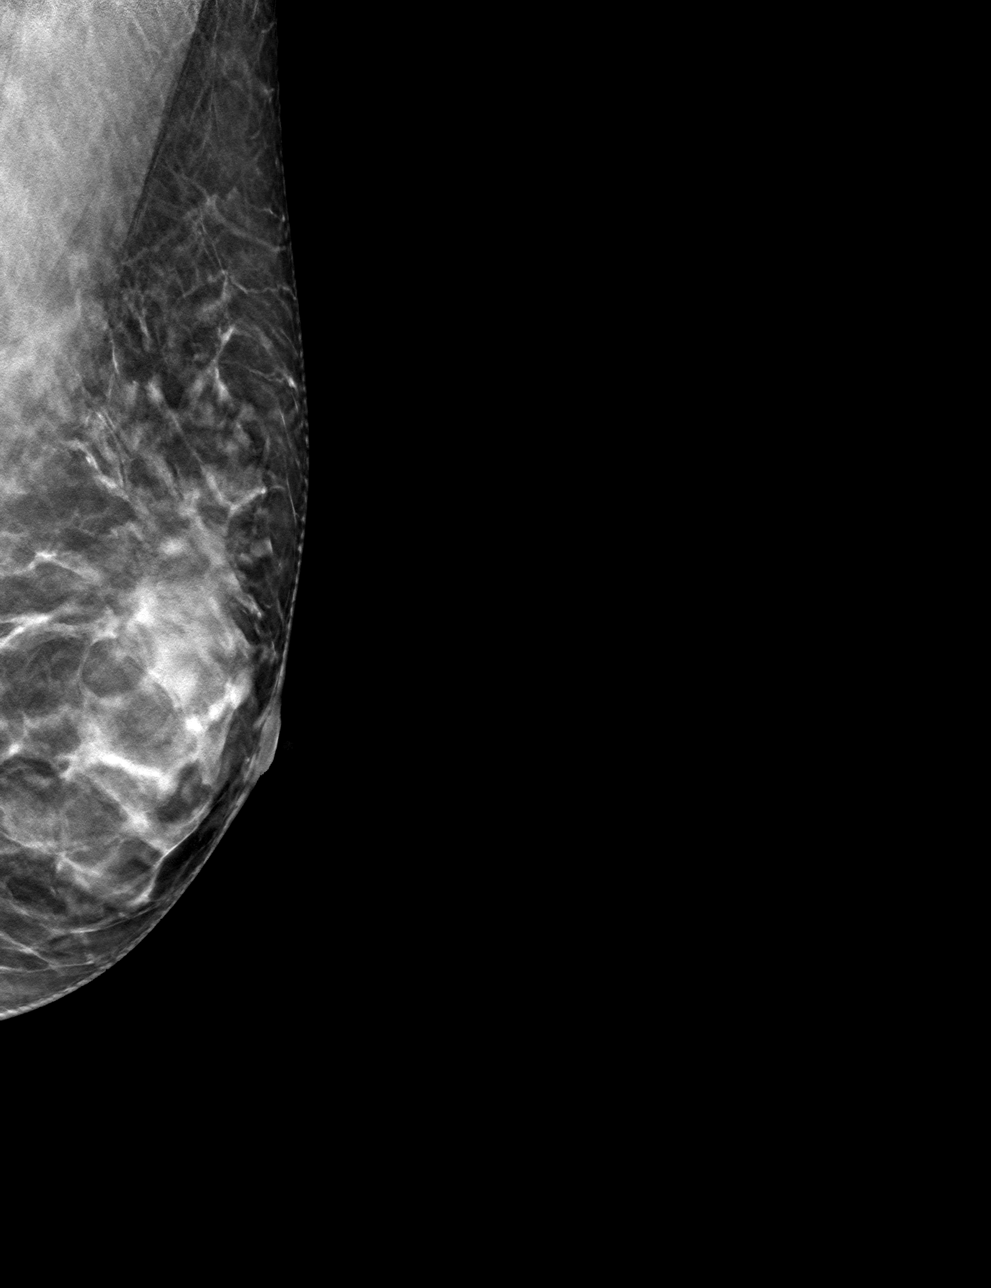

[L CC tomo · tomo slice 27/52.0]
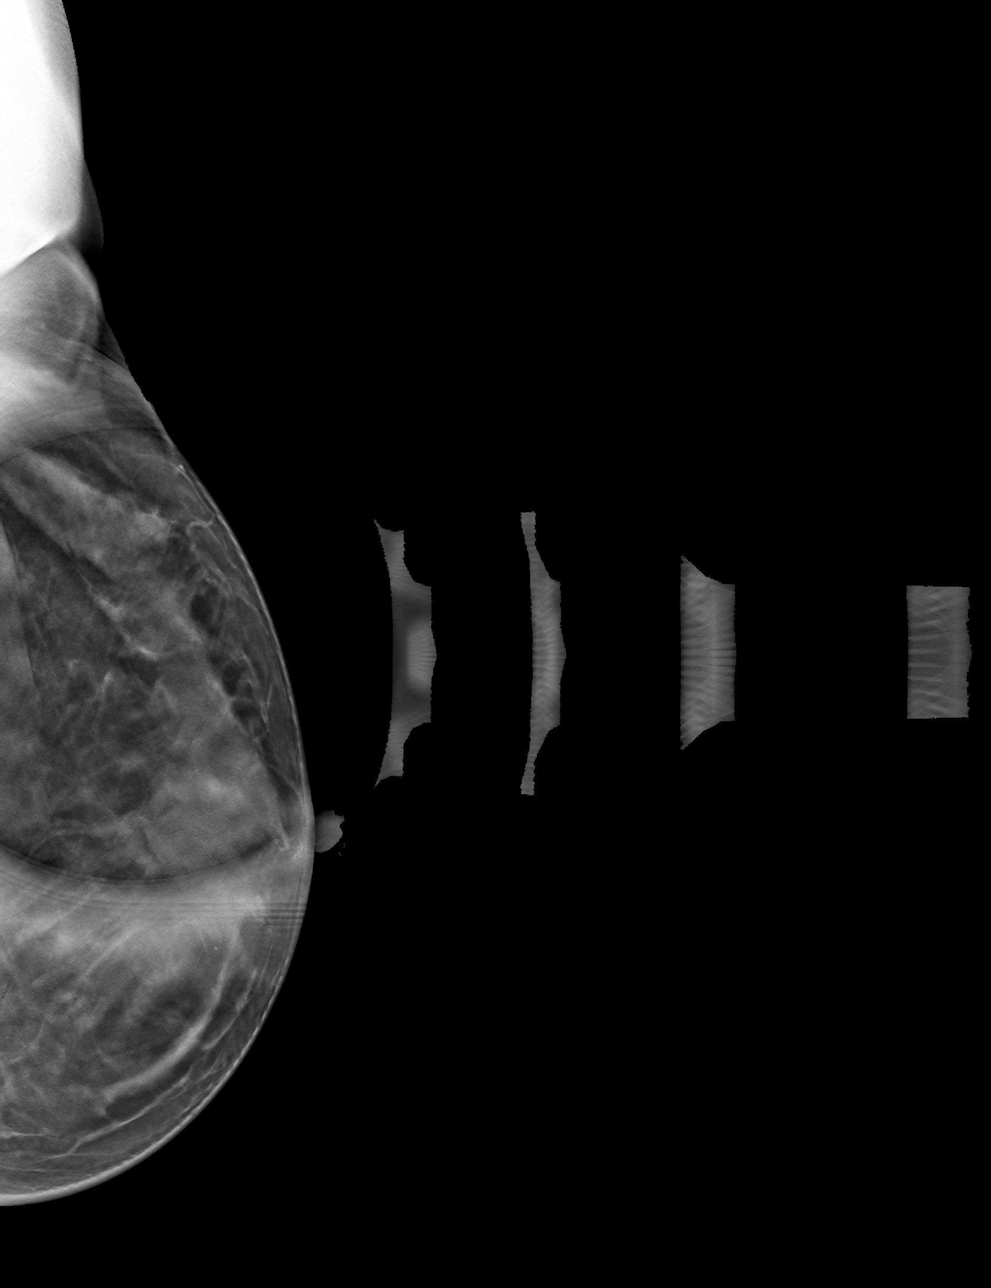

[4 of 12 positions shown; findings below may reference images not displayed]

ACR Breast Density Category c: The breast tissue is heterogeneously
dense, which may obscure small masses.
FINDINGS: The left breast masses persist on today's imaging.

On physical exam, no suspicious lumps are identified.

Targeted ultrasound is performed, showing numerous cysts in the left
breast accounting for the mammographically identified masses.
IMPRESSION: Fibrocystic changes.  No evidence of malignancy.

RECOMMENDATION:
Annual screening mammography.

I have discussed the findings and recommendations with the patient.
If applicable, a reminder letter will be sent to the patient
regarding the next appointment.

BI-RADS CATEGORY  2: Benign.

## 2021-01-07 IMAGING — US US BREAST*L* LIMITED INC AXILLA
1 series · 14 of 17 positions shown · non-contrast
Comparison: Previous exam(s).

CLINICAL DATA: The patient was called back for 2 possible left
breast masses.

EXAM:
DIGITAL DIAGNOSTIC LEFT MAMMOGRAM WITH TOMO
ULTRASOUND LEFT BREAST

[Series 1: us breast*left* limited inc axilla · 0.05mm/px · 14 of 17 slices shown]
[im 1/17]
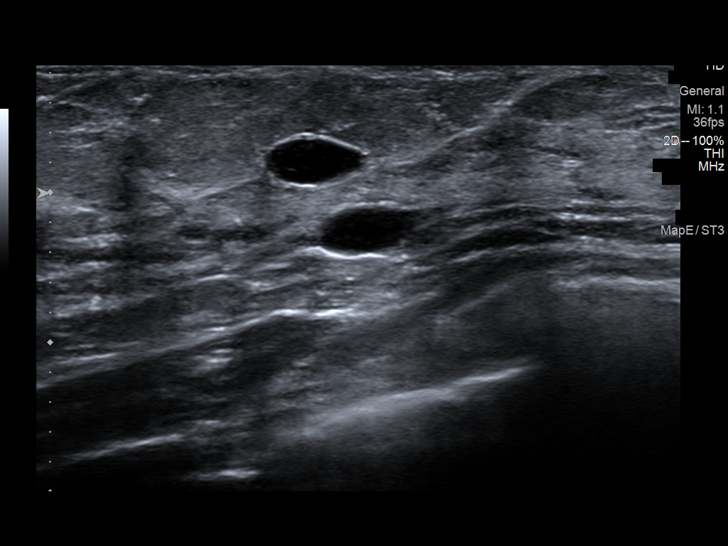
[im 2/17]
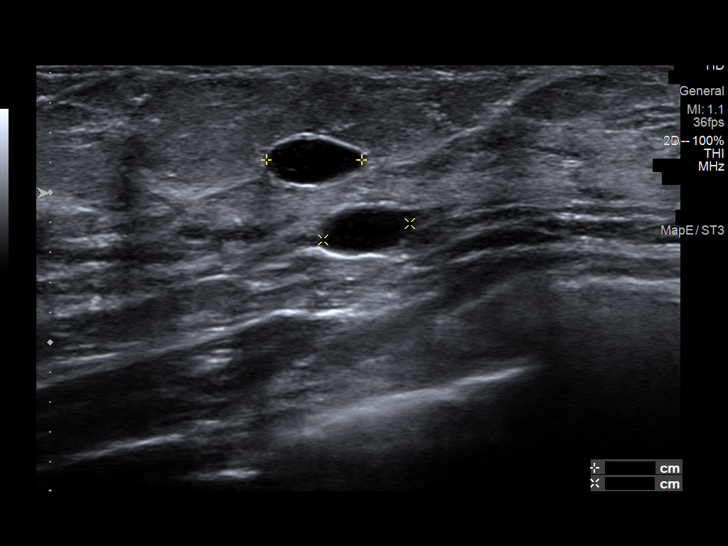
[im 4/17]
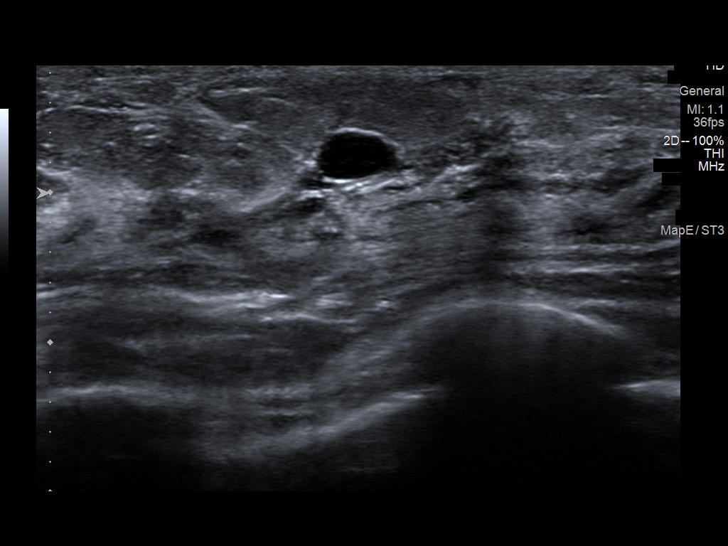
[im 5/17]
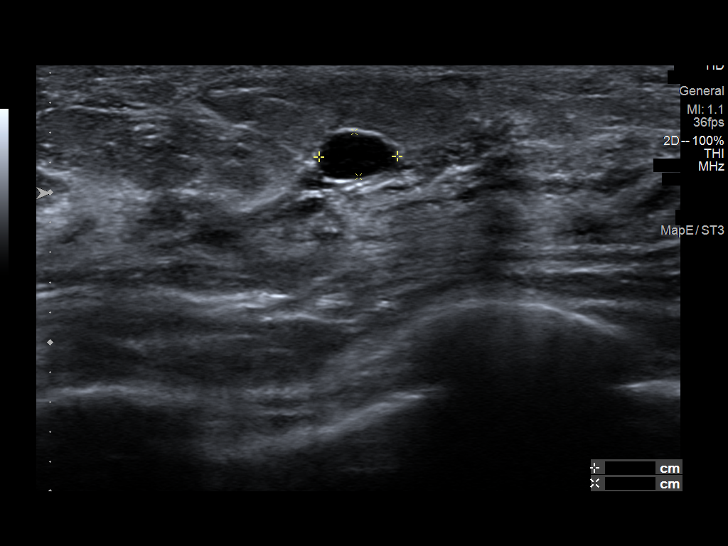
[im 6/17]
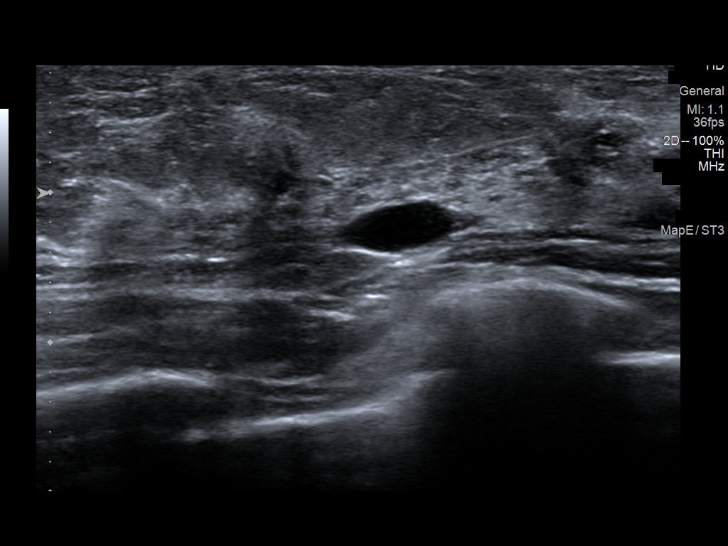
[im 7/17]
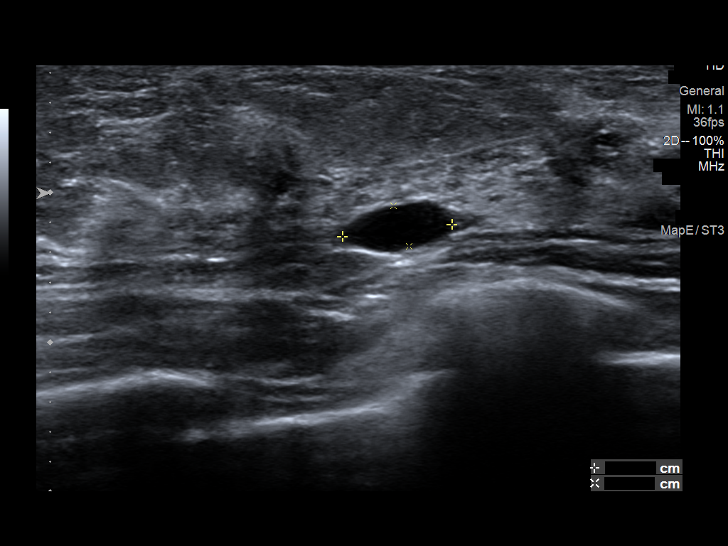
[im 8/17]
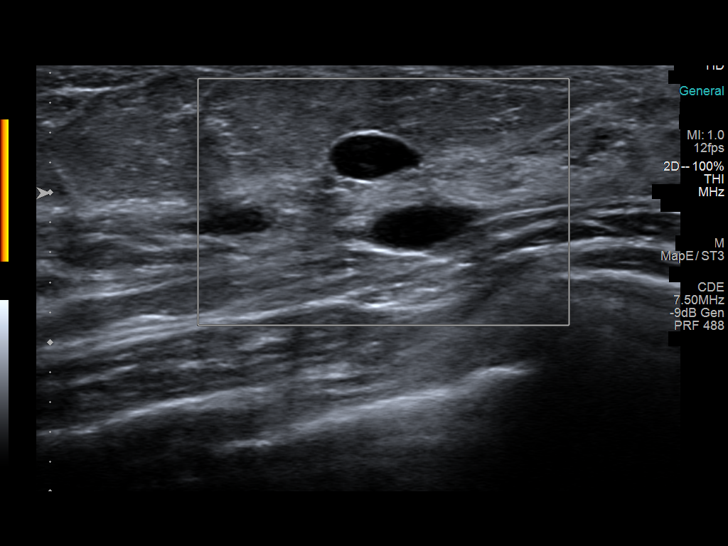
[im 10/17]
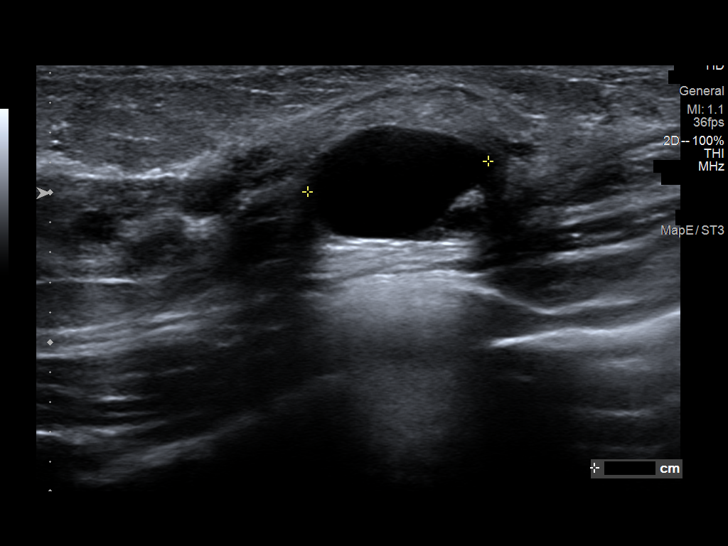
[im 11/17]
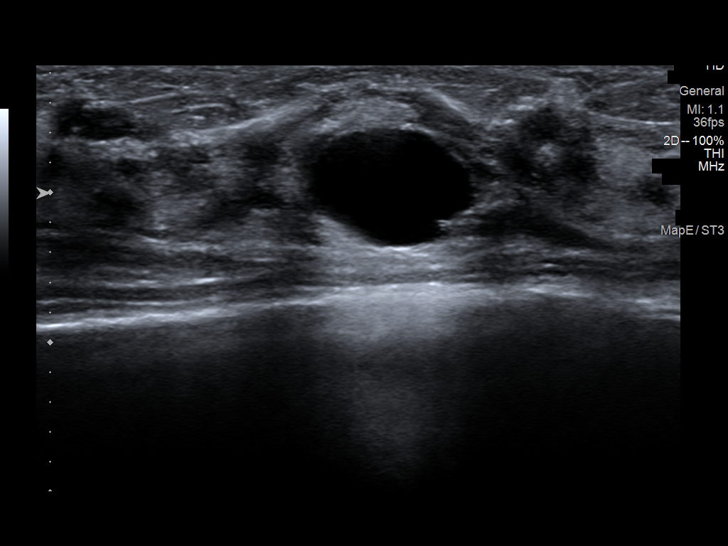
[im 12/17]
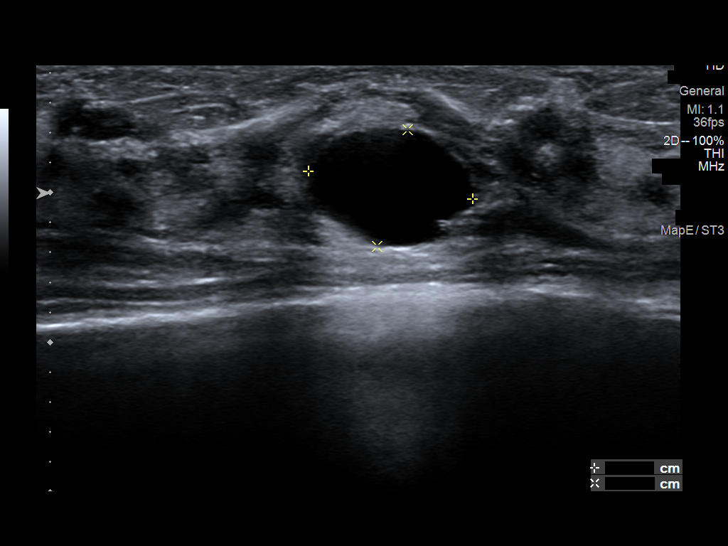
[im 13/17]
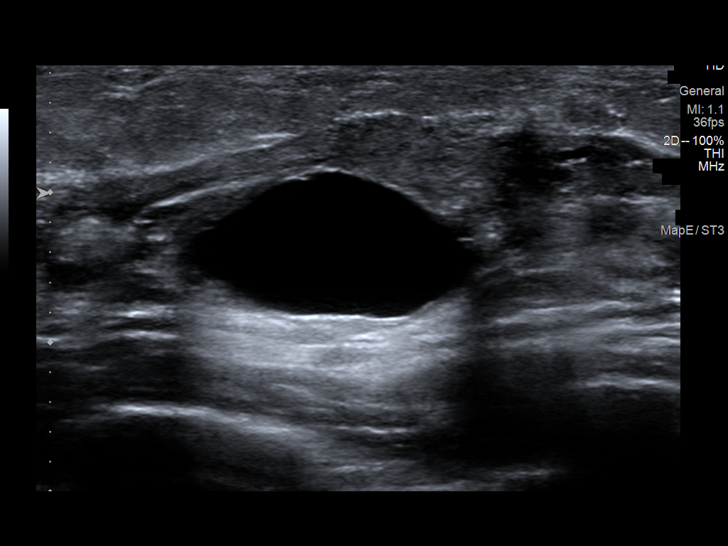
[im 14/17]
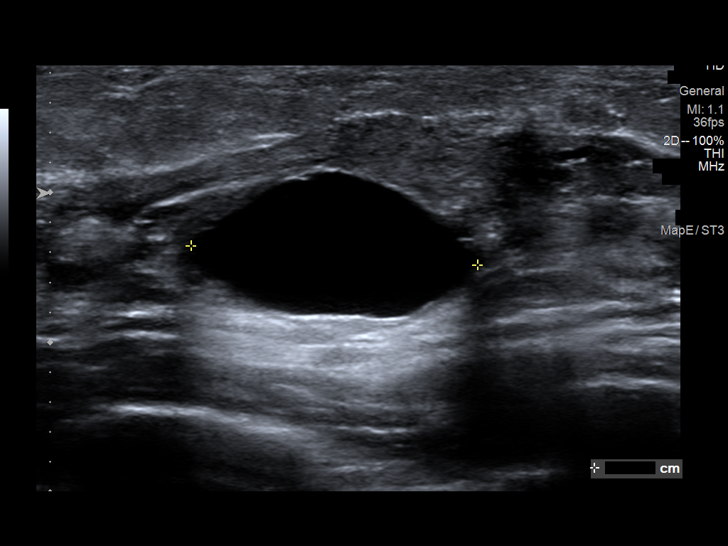
[im 16/17]
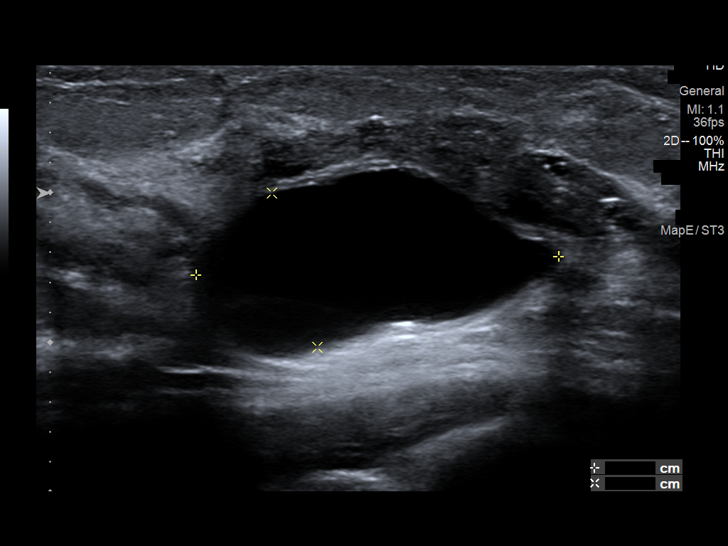
[im 17/17]
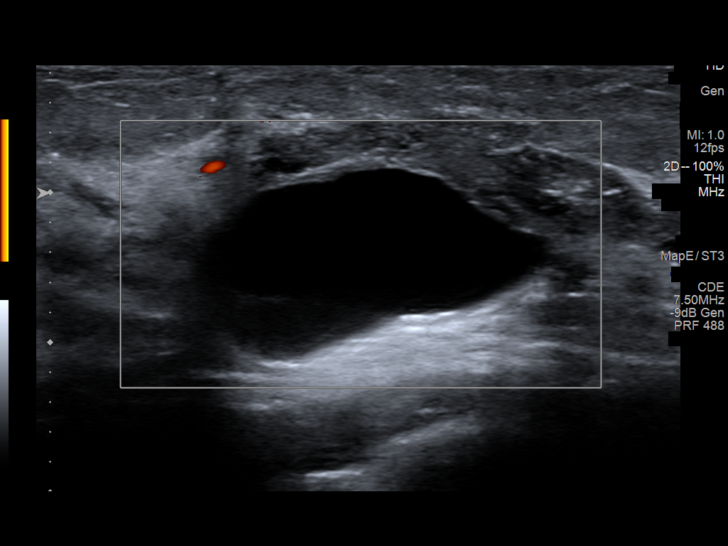

[14 of 17 positions shown; findings below may reference images not displayed]

ACR Breast Density Category c: The breast tissue is heterogeneously
dense, which may obscure small masses.
FINDINGS: The left breast masses persist on today's imaging.

On physical exam, no suspicious lumps are identified.

Targeted ultrasound is performed, showing numerous cysts in the left
breast accounting for the mammographically identified masses.
IMPRESSION: Fibrocystic changes.  No evidence of malignancy.

RECOMMENDATION:
Annual screening mammography.

I have discussed the findings and recommendations with the patient.
If applicable, a reminder letter will be sent to the patient
regarding the next appointment.

BI-RADS CATEGORY  2: Benign.

## 2021-02-23 ENCOUNTER — Encounter: Payer: Self-pay | Admitting: Nurse Practitioner

## 2021-02-23 ENCOUNTER — Ambulatory Visit: Payer: 59 | Admitting: Nurse Practitioner

## 2021-02-23 ENCOUNTER — Other Ambulatory Visit: Payer: Self-pay

## 2021-02-23 VITALS — BP 110/60 | HR 78 | Temp 98.1°F | Ht 65.0 in | Wt 133.2 lb

## 2021-02-23 DIAGNOSIS — R300 Dysuria: Secondary | ICD-10-CM | POA: Diagnosis not present

## 2021-02-23 LAB — POCT URINE PREGNANCY: Preg Test, Ur: NEGATIVE

## 2021-02-23 LAB — POCT URINALYSIS DIPSTICK
Bilirubin, UA: 1
Blood, UA: NEGATIVE
Glucose, UA: NEGATIVE
Ketones, UA: NEGATIVE
Nitrite, UA: POSITIVE
Protein, UA: NEGATIVE
Spec Grav, UA: 1.015 (ref 1.010–1.025)
Urobilinogen, UA: 4 E.U./dL — AB
pH, UA: 5.5 (ref 5.0–8.0)

## 2021-02-23 MED ORDER — SULFAMETHOXAZOLE-TRIMETHOPRIM 800-160 MG PO TABS: 1.0000 | ORAL_TABLET | Freq: Two times a day (BID) | ORAL | 0 refills | Status: DC

## 2021-02-23 NOTE — Progress Notes (Signed)
   Subjective:  Patient ID: Kayla Bowman, female    DOB: [birth year]  Age: 50 y.o. MRN: [id number]  CC: Acute Visit (Pt c/o frequent urination, pain and burning when urinating x 2 days. Pt took [place] with no relief. )  Dysuria  This is a new problem. The current episode started in the past 7 days. The problem occurs every urination. The problem has been unchanged. The quality of the pain is described as burning. There has been no fever. She is sexually active. There is no history of pyelonephritis. Associated symptoms include frequency and urgency. Pertinent negatives include no chills. She has tried increased fluids and home medications for the symptoms. The treatment provided mild relief. Her past medical history is significant for urinary stasis. There is no history of kidney stones or recurrent UTIs.  no vaginal symptoms, denies need for STD screen.  Reviewed past Medical, Social and Family history today.  Outpatient Medications Prior to Visit  Medication Sig Dispense Refill  . Ascorbic Acid (VITAMIN C) 1000 MG tablet Take 1,000 mg by mouth daily.    . B Complex-C (B-COMPLEX WITH VITAMIN C) tablet Take 1 tablet by mouth daily.    . Brimonidine Tartrate 0.025 % SOLN Apply 1 drop to eye every morning.    Kayla Bowman Glycol-Propyl Glycol 0.4-0.3 % SOLN Apply 1 drop to eye 2 (two) times daily.    . vitamin B-12 (CYANOCOBALAMIN) 1000 MCG tablet Take 1,000 mcg by mouth daily.    . vitamin E 100 UNIT capsule Take 100 Units by mouth daily.     No facility-administered medications prior to visit.    ROS See HPI  Objective:  BP 110/60 (BP Location: Left Arm, Patient Position: Sitting, Cuff Size: Normal)   Pulse 78   Temp 98.1 F (36.7 C) (Temporal)   Ht 5\' 5"  (1.651 m)   Wt 133 lb 3.2 oz (60.4 kg)   SpO2 97%   BMI 22.17 kg/m   Physical Exam Vitals reviewed.  Abdominal:     Palpations: Abdomen is soft.     Tenderness: There is no abdominal tenderness. There is no right CVA  tenderness, left CVA tenderness or guarding.  Neurological:     Mental Status: She is alert and oriented to person, place, and time.    Assessment & Plan:  This visit occurred during the SARS-CoV-2 public health emergency.  Safety protocols were in place, including screening questions prior to the visit, additional usage of staff PPE, and extensive cleaning of exam room while observing appropriate contact time as indicated for disinfecting solutions.   Kayla Bowman was seen today for acute visit.  Diagnoses and all orders for this visit:  Dysuria -     POCT urinalysis dipstick -     POCT urine pregnancy -     Urine Culture -     sulfamethoxazole-trimethoprim (BACTRIM DS) 800-160 MG tablet; Take 1 tablet by mouth 2 (two) times daily.   Problem List Items Addressed This Visit   None   Visit Diagnoses    Dysuria    -  Primary   Relevant Medications   sulfamethoxazole-trimethoprim (BACTRIM DS) 800-160 MG tablet   Other Relevant Orders   POCT urinalysis dipstick (Completed)   POCT urine pregnancy (Completed)   Urine Culture (Completed)      Follow-up: No follow-ups on file.  Wilfred Lacy, NP

## 2021-02-23 NOTE — Patient Instructions (Addendum)
Urine sent for culture. Negative urine pregnancy. Bactrim sent. Maintain adequate oral hydration.

## 2021-02-24 LAB — URINE CULTURE
MICRO NUMBER:: 11637691
SPECIMEN QUALITY:: ADEQUATE

## 2021-02-26 ENCOUNTER — Encounter: Payer: Self-pay | Admitting: Nurse Practitioner

## 2021-02-28 ENCOUNTER — Telehealth: Payer: Self-pay | Admitting: Nurse Practitioner

## 2021-02-28 DIAGNOSIS — N3000 Acute cystitis without hematuria: Secondary | ICD-10-CM

## 2021-02-28 MED ORDER — SULFAMETHOXAZOLE-TRIMETHOPRIM 800-160 MG PO TABS: 1.0000 | ORAL_TABLET | Freq: Two times a day (BID) | ORAL | 0 refills | Status: DC

## 2021-02-28 NOTE — Telephone Encounter (Signed)
-----   Message from Arcelia Jew, [place] sent at 02/26/2021  1:51 PM EDT ----- Pt states her symptoms are still lingering and she would like to know if she could get her Rx of bactrim extended

## 2021-08-03 ENCOUNTER — Ambulatory Visit: Payer: 59 | Admitting: Family Medicine

## 2022-01-16 DIAGNOSIS — Z1231 Encounter for screening mammogram for malignant neoplasm of breast: Secondary | ICD-10-CM | POA: Diagnosis not present

## 2022-01-16 DIAGNOSIS — Z01419 Encounter for gynecological examination (general) (routine) without abnormal findings: Secondary | ICD-10-CM | POA: Diagnosis not present

## 2022-01-16 DIAGNOSIS — Z6822 Body mass index (BMI) 22.0-22.9, adult: Secondary | ICD-10-CM | POA: Diagnosis not present

## 2022-01-16 DIAGNOSIS — N926 Irregular menstruation, unspecified: Secondary | ICD-10-CM | POA: Diagnosis not present

## 2022-02-01 ENCOUNTER — Encounter: Payer: Self-pay | Admitting: Family Medicine

## 2022-02-01 ENCOUNTER — Ambulatory Visit (INDEPENDENT_AMBULATORY_CARE_PROVIDER_SITE_OTHER): Payer: BC Managed Care – PPO | Admitting: Family Medicine

## 2022-02-01 ENCOUNTER — Other Ambulatory Visit: Payer: Self-pay

## 2022-02-01 VITALS — BP 115/68 | HR 59 | Temp 97.1°F | Ht 65.0 in | Wt 136.4 lb

## 2022-02-01 DIAGNOSIS — Z1159 Encounter for screening for other viral diseases: Secondary | ICD-10-CM

## 2022-02-01 DIAGNOSIS — Z23 Encounter for immunization: Secondary | ICD-10-CM | POA: Diagnosis not present

## 2022-02-01 DIAGNOSIS — L739 Follicular disorder, unspecified: Secondary | ICD-10-CM

## 2022-02-01 DIAGNOSIS — Z114 Encounter for screening for human immunodeficiency virus [HIV]: Secondary | ICD-10-CM

## 2022-02-01 DIAGNOSIS — Z0000 Encounter for general adult medical examination without abnormal findings: Secondary | ICD-10-CM

## 2022-02-01 DIAGNOSIS — E559 Vitamin D deficiency, unspecified: Secondary | ICD-10-CM

## 2022-02-01 MED ORDER — DOXYCYCLINE HYCLATE 100 MG PO TABS: 100.0000 mg | ORAL_TABLET | Freq: Two times a day (BID) | ORAL | 0 refills | Status: AC

## 2022-02-01 NOTE — Progress Notes (Signed)
Established Patient Office Visit  Subjective:  Patient ID: Kayla Bowman, female    DOB: [birth year]  Age: 51 y.o. MRN: [id number]  CC:  Chief Complaint  Patient presents with   Annual Exam    CPE, no concerns.     HPI Kayla Bowman presents for for physical exam and health check.  Has been doing well.  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  Husband has moved back home with her.  Female care per GYN and is up-to-date.  History reviewed. No pertinent past medical history.  Past Surgical History:  Procedure Laterality Date   LAPAROSCOPIC APPENDECTOMY N/A 12/01/2018   Procedure: APPENDECTOMY LAPAROSCOPIC;  Surgeon: Alphonsa Overall, MD;  Location: [facility];  Service: General;  Laterality: N/A;    History reviewed. No pertinent family history.  Social History   Socioeconomic History   Marital status: Married    Spouse name: Not on file   Number of children: Not on file   Years of education: Not on file   Highest education level: Not on file  Occupational History   Not on file  Tobacco Use   Smoking status: Never   Smokeless tobacco: Never  Substance and Sexual Activity   Alcohol use: Not Currently   Drug use: Never   Sexual activity: Not on file  Other Topics Concern   Not on file  Social History Narrative   Not on file   Social Determinants of Health   Financial Resource Strain: Not on file  Food Insecurity: Not on file  Transportation Needs: Not on file  Physical Activity: Not on file  Stress: Not on file  Social Connections: Not on file  Intimate Partner Violence: Not on file    Outpatient Medications Prior to Visit  Medication Sig Dispense Refill   Ascorbic Acid (VITAMIN C) 1000 MG tablet Take 1,000 mg by mouth daily.     B Complex-C (B-COMPLEX WITH VITAMIN C) tablet Take 1 tablet by mouth daily.     ketotifen (ZADITOR) 0.025 % ophthalmic solution 1 drop 2 (two) times daily.     vitamin B-12 (CYANOCOBALAMIN) 1000  MCG tablet Take 1,000 mcg by mouth daily.     vitamin E 100 UNIT capsule Take 100 Units by mouth daily.     sulfamethoxazole-trimethoprim (BACTRIM DS) 800-160 MG tablet Take 1 tablet by mouth 2 (two) times daily. 10 tablet 0   Brimonidine Tartrate 0.025 % SOLN Apply 1 drop to eye every morning.     Polyethyl Glycol-Propyl Glycol 0.4-0.3 % SOLN Apply 1 drop to eye 2 (two) times daily.     No facility-administered medications prior to visit.    No Known Allergies  ROS Review of Systems  Constitutional:  Negative for diaphoresis, fatigue, fever and unexpected weight change.  HENT: Negative.    Eyes:  Negative for photophobia and visual disturbance.  Respiratory:  Negative for cough and wheezing.   Cardiovascular: Negative.   Gastrointestinal: Negative.   Endocrine: Negative for polyphagia and polyuria.  Genitourinary: Negative.   Musculoskeletal:  Negative for gait problem and joint swelling.  Skin:  Positive for rash.  Neurological:  Negative for speech difficulty and weakness.     Objective:    Physical Exam Vitals and nursing note reviewed.  Constitutional:      General: She is not in acute distress.    Appearance: Normal appearance. She is not ill-appearing, toxic-appearing or diaphoretic.  HENT:     Head: Normocephalic and atraumatic.  Right Ear: Tympanic membrane, ear canal and external ear normal.     Left Ear: Tympanic membrane, ear canal and external ear normal.     Mouth/Throat:     Mouth: Mucous membranes are moist.     Pharynx: No oropharyngeal exudate or posterior oropharyngeal erythema.  Eyes:     General: No scleral icterus.       Right eye: No discharge.        Left eye: No discharge.     Extraocular Movements: Extraocular movements intact.     Conjunctiva/sclera: Conjunctivae normal.     Pupils: Pupils are equal, round, and reactive to light.  Cardiovascular:     Rate and Rhythm: Normal rate and regular rhythm.  Pulmonary:     Effort: Pulmonary  effort is normal.     Breath sounds: Normal breath sounds.  Abdominal:     General: Bowel sounds are normal.  Musculoskeletal:     Cervical back: No rigidity or tenderness.     Right lower leg: No edema.     Left lower leg: No edema.  Lymphadenopathy:     Cervical: No cervical adenopathy.  Skin:    General: Skin is warm and dry.  Neurological:     Mental Status: She is alert and oriented to person, place, and time.  Psychiatric:        Mood and Affect: Mood normal.        Behavior: Behavior normal.    BP 115/68 (BP Location: Right Arm, Patient Position: Sitting, Cuff Size: Normal)    Pulse (!) 59    Temp (!) 97.1 F (36.2 C) (Temporal)    Ht 5\' 5"  (1.651 m)    Wt 136 lb 6.4 oz (61.9 kg)    SpO2 99%    BMI 22.70 kg/m  Wt Readings from Last 3 Encounters:  02/01/22 136 lb 6.4 oz (61.9 kg)  02/23/21 133 lb 3.2 oz (60.4 kg)  07/06/20 136 lb 6 oz (61.9 kg)     Health Maintenance Due  Topic Date Due   Hepatitis C Screening  Never done   TETANUS/TDAP  Never done   Zoster Vaccines- Shingrix (1 of 2) Never done    There are no preventive care reminders to display for this patient.  Lab Results  Component Value Date   TSH 1.22 07/06/2020   Lab Results  Component Value Date   WBC 6.6 07/06/2020   HGB 13.6 07/06/2020   HCT 41.2 07/06/2020   MCV 92.3 07/06/2020   PLT 324.0 07/06/2020   Lab Results  Component Value Date   NA 135 07/06/2020   K 4.4 07/06/2020   CO2 25 07/06/2020   GLUCOSE 86 07/06/2020   BUN 9 07/06/2020   CREATININE 0.78 07/06/2020   BILITOT 1.0 07/06/2020   ALKPHOS 40 07/06/2020   AST 16 07/06/2020   ALT 11 07/06/2020   PROT 6.8 07/06/2020   ALBUMIN 4.3 07/06/2020   CALCIUM 9.2 07/06/2020   ANIONGAP 8 12/01/2018   GFR 78.56 07/06/2020   Lab Results  Component Value Date   CHOL 216 (H) 07/06/2020   Lab Results  Component Value Date   HDL 88.60 07/06/2020   Lab Results  Component Value Date   LDLCALC 120 (H) 07/06/2020   Lab Results   Component Value Date   TRIG 40.0 07/06/2020   Lab Results  Component Value Date   CHOLHDL 2 07/06/2020   No results found for: HGBA1C    Assessment & Plan:  Problem List Items Addressed This Visit       Musculoskeletal and Integument   Folliculitis - Primary   Relevant Medications   doxycycline (VIBRA-TABS) 100 MG tablet     Other   Vitamin D deficiency   Relevant Orders   VITAMIN D 25 Hydroxy (Vit-D Deficiency, Fractures)   Healthcare maintenance   Relevant Orders   CBC   Comprehensive metabolic panel   Lipid panel   Urinalysis, Routine w reflex microscopic   Need for Tdap vaccination   Relevant Orders   Tdap vaccine greater than or equal to 7yo IM    Meds ordered this encounter  Medications   doxycycline (VIBRA-TABS) 100 MG tablet    Sig: Take 1 tablet (100 mg total) by mouth 2 (two) times daily for 14 days.    Dispense:  28 tablet    Refill:  0    Follow-up: Return in about 1 year (around 02/01/2023), or if symptoms worsen or fail to improve.   Will return fasting for above ordered blood work.  Doxycycline twice daily for 14 days for folliculitis.  Will let me know if it does not clear.  Sun sensitivity warning given.  Rechecking vitamin D.  She has been faithfully taking it daily.  Needs a Tdap today.  Information was given on health maintenance and disease prevention. Libby Maw, MD

## 2022-02-07 ENCOUNTER — Other Ambulatory Visit (INDEPENDENT_AMBULATORY_CARE_PROVIDER_SITE_OTHER): Payer: BC Managed Care – PPO

## 2022-02-07 ENCOUNTER — Other Ambulatory Visit: Payer: Self-pay

## 2022-02-07 DIAGNOSIS — Z0000 Encounter for general adult medical examination without abnormal findings: Secondary | ICD-10-CM | POA: Diagnosis not present

## 2022-02-07 DIAGNOSIS — Z1159 Encounter for screening for other viral diseases: Secondary | ICD-10-CM | POA: Diagnosis not present

## 2022-02-07 DIAGNOSIS — Z114 Encounter for screening for human immunodeficiency virus [HIV]: Secondary | ICD-10-CM

## 2022-02-07 DIAGNOSIS — E559 Vitamin D deficiency, unspecified: Secondary | ICD-10-CM | POA: Diagnosis not present

## 2022-02-07 DIAGNOSIS — N3000 Acute cystitis without hematuria: Secondary | ICD-10-CM

## 2022-02-07 LAB — COMPREHENSIVE METABOLIC PANEL
ALT: 14 U/L (ref 0–35)
AST: 19 U/L (ref 0–37)
Albumin: 4.2 g/dL (ref 3.5–5.2)
Alkaline Phosphatase: 33 U/L — ABNORMAL LOW (ref 39–117)
BUN: 16 mg/dL (ref 6–23)
CO2: 27 mEq/L (ref 19–32)
Calcium: 9 mg/dL (ref 8.4–10.5)
Chloride: 108 mEq/L (ref 96–112)
Creatinine, Ser: 0.92 mg/dL (ref 0.40–1.20)
GFR: 72.66 mL/min (ref >60.00)
Glucose, Bld: 102 mg/dL — ABNORMAL HIGH (ref 70–99)
Potassium: 4.3 mEq/L (ref 3.5–5.1)
Sodium: 141 mEq/L (ref 135–145)
Total Bilirubin: 0.5 mg/dL (ref 0.2–1.2)
Total Protein: 6.7 g/dL (ref 6.0–8.3)

## 2022-02-07 LAB — LIPID PANEL
Cholesterol: 204 mg/dL — ABNORMAL HIGH (ref 0–200)
HDL: 73.9 mg/dL (ref >39.00)
LDL Cholesterol: 120 mg/dL — ABNORMAL HIGH (ref 0–99)
NonHDL: 129.83
Total CHOL/HDL Ratio: 3
Triglycerides: 51 mg/dL (ref 0.0–149.0)
VLDL: 10.2 mg/dL (ref 0.0–40.0)

## 2022-02-07 LAB — CBC
HCT: 39.9 % (ref 36.0–46.0)
Hemoglobin: 13.2 g/dL (ref 12.0–15.0)
MCHC: 33.2 g/dL (ref 30.0–36.0)
MCV: 91 fl (ref 78.0–100.0)
Platelets: 288 10*3/uL (ref 150.0–400.0)
RBC: 4.38 Mil/uL (ref 3.87–5.11)
RDW: 13.5 % (ref 11.5–15.5)
WBC: 3.9 10*3/uL — ABNORMAL LOW (ref 4.0–10.5)

## 2022-02-07 LAB — URINALYSIS, ROUTINE W REFLEX MICROSCOPIC
Bilirubin Urine: NEGATIVE
Hgb urine dipstick: NEGATIVE
Ketones, ur: NEGATIVE
Leukocytes,Ua: NEGATIVE
Nitrite: NEGATIVE
RBC / HPF: NONE SEEN (ref 0–?)
Specific Gravity, Urine: 1.025 (ref 1.000–1.030)
Total Protein, Urine: NEGATIVE
Urine Glucose: NEGATIVE
Urobilinogen, UA: 0.2 (ref 0.0–1.0)
pH: 6 (ref 5.0–8.0)

## 2022-02-07 LAB — VITAMIN D 25 HYDROXY (VIT D DEFICIENCY, FRACTURES): VITD: 51.06 ng/mL (ref 30.00–100.00)

## 2022-02-13 LAB — URINALYSIS W MICROSCOPIC + REFLEX CULTURE
Bilirubin Urine: NEGATIVE
Glucose, UA: NEGATIVE
Hgb urine dipstick: NEGATIVE
Hyaline Cast: NONE SEEN /LPF
Ketones, ur: NEGATIVE
Leukocyte Esterase: NEGATIVE
Nitrites, Initial: NEGATIVE
Protein, ur: NEGATIVE
RBC / HPF: NONE SEEN /HPF (ref 0–2)
Specific Gravity, Urine: 1.023 (ref 1.001–1.035)
WBC, UA: NONE SEEN /HPF (ref 0–5)
pH: 5.5 (ref 5.0–8.0)

## 2022-02-13 LAB — HIV ANTIBODY (ROUTINE TESTING W REFLEX): HIV 1&2 Ab, 4th Generation: NONREACTIVE

## 2022-02-13 LAB — HEPATITIS C ANTIBODY
Hepatitis C Ab: NONREACTIVE
SIGNAL TO CUT-OFF: <0.02 (ref <1.00)

## 2022-02-13 LAB — NO CULTURE INDICATED

## 2022-03-04 DIAGNOSIS — L710 Perioral dermatitis: Secondary | ICD-10-CM | POA: Diagnosis not present

## 2022-03-04 DIAGNOSIS — L821 Other seborrheic keratosis: Secondary | ICD-10-CM | POA: Diagnosis not present

## 2022-03-04 DIAGNOSIS — L814 Other melanin hyperpigmentation: Secondary | ICD-10-CM | POA: Diagnosis not present

## 2022-03-04 DIAGNOSIS — D225 Melanocytic nevi of trunk: Secondary | ICD-10-CM | POA: Diagnosis not present

## 2022-05-17 ENCOUNTER — Encounter: Payer: Self-pay | Admitting: Nurse Practitioner

## 2022-06-04 DIAGNOSIS — L718 Other rosacea: Secondary | ICD-10-CM | POA: Diagnosis not present

## 2022-06-04 DIAGNOSIS — L811 Chloasma: Secondary | ICD-10-CM | POA: Diagnosis not present

## 2022-06-04 DIAGNOSIS — L710 Perioral dermatitis: Secondary | ICD-10-CM | POA: Diagnosis not present

## 2022-06-13 ENCOUNTER — Ambulatory Visit: Payer: BC Managed Care – PPO | Admitting: Nurse Practitioner

## 2022-06-13 ENCOUNTER — Encounter: Payer: Self-pay | Admitting: Nurse Practitioner

## 2022-06-13 VITALS — BP 102/60 | HR 66 | Ht 65.0 in | Wt 142.2 lb

## 2022-06-13 DIAGNOSIS — Z1211 Encounter for screening for malignant neoplasm of colon: Secondary | ICD-10-CM | POA: Diagnosis not present

## 2022-06-13 MED ORDER — PLENVU 140 G PO SOLR
140.0000 g | ORAL | 0 refills | Status: DC
Start: 2022-06-13 — End: 2022-08-01

## 2022-06-13 NOTE — Progress Notes (Signed)
06/13/2022 Kayla Bowman [id number] 27-Apr-1971   CHIEF COMPLAINT: Schedule a colonoscopy  HISTORY OF PRESENT ILLNESS: Kayla Bowman is a 51 year old female with a past medical history of a heart murmur and acid reflux.  She presents to our office today as referred by Dr. Louretta Shorten to schedule a screening colonoscopy.  She underwent an EGD and colonoscopy in 2017 in [place].  She reported the EGD showed evidence of acid reflux and a possible esophageal polyp.  The colonoscopy was reported as normal, no polyps.  Her father was diagnosed with rectal cancer at the age of 60.  Mother with history of colon polyps.  She denies having any upper or lower abdominal pain.  No dysphagia or heartburn.  She is passing a normal formed brown bowel movement daily.  No rectal bleeding or black stools.       Latest Ref Rng & Units 02/07/2022    7:59 AM 07/06/2020    3:48 PM 06/28/2019    3:28 PM  CBC  WBC 4.0 - 10.5 K/uL 3.9  6.6  7.2   Hemoglobin 12.0 - 15.0 g/dL 13.2  13.6  12.0   Hematocrit 36.0 - 46.0 % 39.9  41.2  36.3   Platelets 150.0 - 400.0 K/uL 288.0  324.0  301        Latest Ref Rng & Units 02/07/2022    7:59 AM 07/06/2020    3:48 PM 12/01/2018    5:09 PM  CMP  Glucose 70 - 99 mg/dL 102  86  109   BUN 6 - 23 mg/dL '16  9  11   '$ Creatinine 0.40 - 1.20 mg/dL 0.92  0.78  0.65   Sodium 135 - 145 mEq/L 141  135  139   Potassium 3.5 - 5.1 mEq/L 4.3  4.4  4.1   Chloride 96 - 112 mEq/L 108  103  108   CO2 19 - 32 mEq/L '27  25  23   '$ Calcium 8.4 - 10.5 mg/dL 9.0  9.2  8.9   Total Protein 6.0 - 8.3 g/dL 6.7  6.8  6.8   Total Bilirubin 0.2 - 1.2 mg/dL 0.5  1.0  1.1   Alkaline Phos 39 - 117 U/L 33  40  30   AST 0 - 37 U/L '19  16  18   '$ ALT 0 - 35 U/L '14  11  15       '$ Past Surgical History:  Procedure Laterality Date   LAPAROSCOPIC APPENDECTOMY N/A 12/01/2018   Procedure: APPENDECTOMY LAPAROSCOPIC;  Surgeon: Alphonsa Overall, MD;  Location: [facility];  Service: General;  Laterality: N/A;   Social  History: She is married.  She is a Government social research officer. She drinks one glass of white wine daily. Nonsmoker.  No drug use.  Family History: Father diagnosed with rectal cancer at the ge of 47. Mother history of colon polyps. Half brother is healthy. Maternal aunt with breast cancer.   Allergies  Allergen Reactions   Iodine Itching      Outpatient Encounter Medications as of 06/13/2022  Medication Sig   Ascorbic Acid (VITAMIN C) 1000 MG tablet Take 1,000 mg by mouth daily.   B Complex-C (B-COMPLEX WITH VITAMIN C) tablet Take 1 tablet by mouth daily.   vitamin B-12 (CYANOCOBALAMIN) 1000 MCG tablet Take 1,000 mcg by mouth daily.   vitamin E 100 UNIT capsule Take 100 Units by mouth daily.   No facility-administered encounter medications on file as of 06/13/2022.  REVIEW OF SYSTEMS:  Gen: Denies fever, sweats or chills. No weight loss.  CV: Denies chest pain, palpitations or edema. Resp: Denies cough, shortness of breath of hemoptysis.  GI: Denies heartburn, dysphagia, stomach or lower abdominal pain. No diarrhea or constipation.  GU : Denies urinary burning, blood in urine, increased urinary frequency or incontinence. MS: Denies joint pain, muscles aches or weakness. Derm: Denies rash, itchiness, skin lesions or unhealing ulcers. Psych: Denies depression, anxiety or memory loss. Heme: Denies bruising, easy bleeding. Neuro:  Denies headaches, dizziness or paresthesias. Endo:  Denies any problems with DM, thyroid or adrenal function.  PHYSICAL EXAM: BP 102/60   Pulse 66   Ht '5\' 5"'$  (1.651 m)   Wt 142 lb 3.2 oz (64.5 kg)   BMI 23.66 kg/m  General: 51 year old female alert in no acute distress. Head: Normocephalic and atraumatic. Eyes:  Sclerae non-icteric, conjunctive pink. Ears: Normal auditory acuity. Mouth: Dentition intact. No ulcers or lesions.  Neck: Supple, no lymphadenopathy or thyromegaly.  Lungs: Clear bilaterally to auscultation without wheezes, crackles or  rhonchi. Heart: Regular rate and rhythm. No murmur, rub or gallop appreciated.  Abdomen: Soft, nontender, non distended. No masses. No hepatosplenomegaly. Normoactive bowel sounds x 4 quadrants.  Rectal: Deferred. Musculoskeletal: Symmetrical with no gross deformities. Skin: Warm and dry. No rash or lesions on visible extremities. Extremities: No edema. Neurological: Alert oriented x 4, no focal deficits.  Psychological:  Alert and cooperative. Normal mood and affect.  ASSESSMENT AND PLAN:  25) 51 year old female presents to schedule screening colonoscopy.  Patient reported normal colonoscopy in [place] in 2017.  Father with history of rectal cancer.  Mother with history of colon polyps. -Colonoscopy benefits and risks discussed including risk with sedation, risk of bleeding, perforation and infection  -Further recommendations to be determined after colonoscopy completed  2) Prior history of GERD.  Asymptomatic.  Patient reported EGD in [place] in 2017 showed evidence of GERD and a possible esophageal polyp.  No known family history of gastric cancer. -Patient will attempt to obtain EGD 2017 records for further review    CC:  Louretta Shorten, MD

## 2022-06-13 NOTE — Patient Instructions (Signed)
If you are age 51 or older, your body mass index should be between 23-30. Your Body mass index is 23.66 kg/m. If this is out of the aforementioned range listed, please consider follow up with your Primary Care Provider.  If you are age 52 or younger, your body mass index should be between 19-25. Your Body mass index is 23.66 kg/m. If this is out of the aformentioned range listed, please consider follow up with your Primary Care Provider.   ________________________________________________________  The [facility] providers would like to encourage you to use [facility] to communicate with providers for non-urgent requests or questions.  Due to long hold times on the telephone, sending your provider a message by [facility] may be a faster and more efficient way to get a response.  Please allow 48 business hours for a response.  Please remember that this is for non-urgent requests.  _______________________________________________________  Dennis Bast have been scheduled for a colonoscopy. Please follow written instructions given to you at your visit today.  Please pick up your prep supplies at the pharmacy within the next 1-3 days. If you use inhalers (even only as needed), please bring them with you on the day of your procedure.  It was a pleasure to see you today!  Thank you for trusting me with your gastrointestinal care!

## 2022-06-20 DIAGNOSIS — M25561 Pain in right knee: Secondary | ICD-10-CM | POA: Diagnosis not present

## 2022-06-26 NOTE — Progress Notes (Signed)
Agree with the assessment and plan as outlined by Colleen Kennedy-Smith, NP.   Trevious Rampey, DO, FACG [facility]   

## 2022-07-25 ENCOUNTER — Encounter: Payer: Self-pay | Admitting: Gastroenterology

## 2022-08-01 ENCOUNTER — Ambulatory Visit (AMBULATORY_SURGERY_CENTER): Payer: BC Managed Care – PPO | Admitting: Gastroenterology

## 2022-08-01 ENCOUNTER — Encounter: Payer: Self-pay | Admitting: Gastroenterology

## 2022-08-01 VITALS — BP 110/70 | HR 65 | Temp 97.8°F | Resp 12 | Ht 65.0 in | Wt 142.0 lb

## 2022-08-01 DIAGNOSIS — D124 Benign neoplasm of descending colon: Secondary | ICD-10-CM

## 2022-08-01 DIAGNOSIS — D123 Benign neoplasm of transverse colon: Secondary | ICD-10-CM | POA: Diagnosis not present

## 2022-08-01 DIAGNOSIS — K640 First degree hemorrhoids: Secondary | ICD-10-CM

## 2022-08-01 DIAGNOSIS — Z800 Family history of malignant neoplasm of digestive organs: Secondary | ICD-10-CM | POA: Diagnosis not present

## 2022-08-01 DIAGNOSIS — D125 Benign neoplasm of sigmoid colon: Secondary | ICD-10-CM | POA: Diagnosis not present

## 2022-08-01 DIAGNOSIS — Z8371 Family history of colonic polyps: Secondary | ICD-10-CM | POA: Diagnosis not present

## 2022-08-01 DIAGNOSIS — K5730 Diverticulosis of large intestine without perforation or abscess without bleeding: Secondary | ICD-10-CM

## 2022-08-01 DIAGNOSIS — Z1211 Encounter for screening for malignant neoplasm of colon: Secondary | ICD-10-CM | POA: Diagnosis not present

## 2022-08-01 DIAGNOSIS — K635 Polyp of colon: Secondary | ICD-10-CM

## 2022-08-01 MED ORDER — SODIUM CHLORIDE 0.9 % IV SOLN: 500.0000 mL | Freq: Once | INTRAVENOUS | Status: DC

## 2022-08-01 NOTE — Progress Notes (Signed)
Report to pacu rn. Vss. Care resumed by rn. 

## 2022-08-01 NOTE — Op Note (Signed)
[facility] Patient Name: Kayla Bowman Procedure Date: 08/01/2022 11:07 AM MRN: [id number] Endoscopist: Gerrit Heck , MD Age: 51 Referring MD:  Date of Birth: 1971-04-05 Gender: Female Account #: [id number] Procedure:                Colonoscopy Indications:              Colon cancer screening in patient at increased                            risk: Colorectal cancer in father 59 or older,                            mother with colon polyps.                           No recent GI symptoms. Medicines:                Monitored Anesthesia Care Procedure:                Pre-Anesthesia Assessment:                           - Prior to the procedure, a History and Physical                            was performed, and patient medications and                            allergies were reviewed. The patient's tolerance of                            previous anesthesia was also reviewed. The risks                            and benefits of the procedure and the sedation                            options and risks were discussed with the patient.                            All questions were answered, and informed consent                            was obtained. Prior Anticoagulants: The patient has                            taken no previous anticoagulant or antiplatelet                            agents. ASA Grade Assessment: I - A normal, healthy                            patient. After reviewing the risks and benefits,  the patient was deemed in satisfactory condition to                            undergo the procedure.                           After obtaining informed consent, the colonoscope                            was passed under direct vision. Throughout the                            procedure, the patient's blood pressure, pulse, and                            oxygen saturations were monitored continuously. The                             Olympus CF-HQ190L (Serial# [id number]) Colonoscope was                            introduced through the anus and advanced to the the                            terminal ileum. The colonoscopy was performed                            without difficulty. The patient tolerated the                            procedure well. The quality of the bowel                            preparation was excellent. The terminal ileum,                            ileocecal valve, appendiceal orifice, and rectum                            were photographed. Scope In: 11:10:38 AM Scope Out: 11:29:23 AM Scope Withdrawal Time: 0 hours 14 minutes 5 seconds  Total Procedure Duration: 0 hours 18 minutes 45 seconds  Findings:                 Skin tags were found on perianal exam.                           A 3 mm polyp was found in the transverse colon. The                            polyp was sessile. The polyp was removed with a                            cold snare. Resection and retrieval were complete.  Estimated blood loss was minimal.                           A 9 mm polyp was found in the descending colon. The                            polyp was sessile. The polyp was removed with a                            cold snare. Resection and retrieval were complete.                            Estimated blood loss was minimal.                           A 5 mm polyp was found in the sigmoid colon. The                            polyp was sessile. The polyp was removed with a                            cold snare. Resection and retrieval were complete.                            Estimated blood loss was minimal.                           A single small-mouthed diverticulum was found in                            the ascending colon.                           Non-bleeding internal hemorrhoids were found during                            retroflexion. The hemorrhoids were small.                            The terminal ileum appeared normal. Complications:            No immediate complications. Estimated Blood Loss:     Estimated blood loss was minimal. Impression:               - Perianal skin tags found on perianal exam.                           - One 3 mm polyp in the transverse colon, removed                            with a cold snare. Resected and retrieved.                           - One 9 mm polyp in the descending colon, removed  with a cold snare. Resected and retrieved.                           - One 5 mm polyp in the sigmoid colon, removed with                            a cold snare. Resected and retrieved.                           - Diverticulosis in the ascending colon.                           - Non-bleeding internal hemorrhoids.                           - The examined portion of the ileum was normal. Recommendation:           - Patient has a contact number available for                            emergencies. The signs and symptoms of potential                            delayed complications were discussed with the                            patient. Return to normal activities tomorrow.                            Written discharge instructions were provided to the                            patient.                           - Resume previous diet.                           - Continue present medications.                           - Await pathology results.                           - Repeat colonoscopy in 3 - 5 years for                            surveillance based on pathology results.                           - Return to GI office PRN. Gerrit Heck, MD 08/01/2022 11:35:24 AM

## 2022-08-01 NOTE — Progress Notes (Signed)
GASTROENTEROLOGY PROCEDURE H&P NOTE   Primary Care Physician: Libby Maw, MD    Reason for Procedure:  Colon Cancer screening, Fhx of colon cancer  Plan:    Colonoscopy  Patient is appropriate for endoscopic procedure(s) in the ambulatory ([facility]) setting.  The nature of the procedure, as well as the risks, benefits, and alternatives were carefully and thoroughly reviewed with the patient. Ample time for discussion and questions allowed. The patient understood, was satisfied, and agreed to proceed.     HPI: Kayla Bowman is a 51 y.o. female who presents for colonoscopy for Colon Cancer screening.  Fhx notable for father with rectal CA at age 41 and mother with colon polyps. Last colonoscopy as 2017 in [place]. Patient is otherwise without complaints or active issues today.  History reviewed. No pertinent past medical history.  Past Surgical History:  Procedure Laterality Date   LAPAROSCOPIC APPENDECTOMY N/A 12/01/2018   Procedure: APPENDECTOMY LAPAROSCOPIC;  Surgeon: Alphonsa Overall, MD;  Location: [facility];  Service: General;  Laterality: N/A;    Prior to Admission medications   Medication Sig Start Date End Date Taking? Authorizing Provider  Ascorbic Acid (VITAMIN C) 1000 MG tablet Take 1,000 mg by mouth daily.   Yes [provider]  B Complex-C (B-COMPLEX WITH VITAMIN C) tablet Take 1 tablet by mouth daily.   Yes [provider]  vitamin B-12 (CYANOCOBALAMIN) 1000 MCG tablet Take 1,000 mcg by mouth daily.   Yes [provider]  vitamin E 100 UNIT capsule Take 100 Units by mouth daily.   Yes [provider]    Current Outpatient Medications  Medication Sig Dispense Refill   Ascorbic Acid (VITAMIN C) 1000 MG tablet Take 1,000 mg by mouth daily.     B Complex-C (B-COMPLEX WITH VITAMIN C) tablet Take 1 tablet by mouth daily.     vitamin B-12 (CYANOCOBALAMIN) 1000 MCG tablet Take 1,000 mcg by mouth daily.     vitamin E 100 UNIT  capsule Take 100 Units by mouth daily.     Current Facility-Administered Medications  Medication Dose Route Frequency Provider Last Rate Last Admin   0.9 %  sodium chloride infusion  500 mL Intravenous Once Coley Kulikowski V, DO        Allergies as of 08/01/2022 - Review Complete 08/01/2022  Allergen Reaction Noted   Iodine Itching 06/13/2022    Family History  Problem Relation Age of Onset   Rectal cancer Father     Social History   Socioeconomic History   Marital status: Married    Spouse name: Not on file   Number of children: Not on file   Years of education: Not on file   Highest education level: Not on file  Occupational History   Not on file  Tobacco Use   Smoking status: Never   Smokeless tobacco: Never  Substance and Sexual Activity   Alcohol use: Yes    Alcohol/week: 7.0 standard drinks of alcohol    Types: 7 Glasses of wine per week   Drug use: Never   Sexual activity: Not on file  Other Topics Concern   Not on file  Social History Narrative   Not on file   Social Determinants of Health   Financial Resource Strain: Not on file  Food Insecurity: Not on file  Transportation Needs: Not on file  Physical Activity: Not on file  Stress: Not on file  Social Connections: Not on file  Intimate Partner Violence: Not on file  Physical Exam: Vital signs in last 24 hours: '@BP'$  113/65   Pulse 60   Temp 97.8 F (36.6 C)   Ht '5\' 5"'$  (1.651 m)   Wt 142 lb (64.4 kg)   SpO2 100%   BMI 23.63 kg/m  GEN: NAD EYE: Sclerae anicteric ENT: MMM CV: Non-tachycardic Pulm: CTA b/l GI: Soft, NT/ND NEURO:  Alert & Oriented x 3   Gerrit Heck, DO [facility]   08/01/2022 11:03 AM

## 2022-08-01 NOTE — Progress Notes (Signed)
Called to room to assist during endoscopic procedure.  Patient ID and intended procedure confirmed with present staff. Received instructions for my participation in the procedure from the performing physician.  

## 2022-08-01 NOTE — Patient Instructions (Signed)
Handout provided about diverticulosis , hemorrhoids and polyps.  Await pathology results.  Resume previous diet.  Continue present medications.   Repeat colonoscopy in 3-5 years for surveillance based on pathology results.     YOU HAD AN ENDOSCOPIC PROCEDURE TODAY AT [facility]:   Refer to the procedure report that was given to you for any specific questions about what was found during the examination.  If the procedure report does not answer your questions, please call your gastroenterologist to clarify.  If you requested that your care partner not be given the details of your procedure findings, then the procedure report has been included in a sealed envelope for you to review at your convenience later.  YOU SHOULD EXPECT: Some feelings of bloating in the abdomen. Passage of more gas than usual.  Walking can help get rid of the air that was put into your GI tract during the procedure and reduce the bloating. If you had a lower endoscopy (such as a colonoscopy or flexible sigmoidoscopy) you may notice spotting of blood in your stool or on the toilet paper. If you underwent a bowel prep for your procedure, you may not have a normal bowel movement for a few days.  Please Note:  You might notice some irritation and congestion in your nose or some drainage.  This is from the oxygen used during your procedure.  There is no need for concern and it should clear up in a day or so.  SYMPTOMS TO REPORT IMMEDIATELY:  Following lower endoscopy (colonoscopy or flexible sigmoidoscopy):  Excessive amounts of blood in the stool  Significant tenderness or worsening of abdominal pains  Swelling of the abdomen that is new, acute  Fever of 100F or higher   For urgent or emergent issues, a gastroenterologist can be reached at any hour by calling [phone]. Do not use MyChart messaging for urgent concerns.    DIET:  We do recommend a small meal at first, but then you may proceed to  your regular diet.  Drink plenty of fluids but you should avoid alcoholic beverages for 24 hours.  ACTIVITY:  You should plan to take it easy for the rest of today and you should NOT DRIVE or use heavy machinery until tomorrow (because of the sedation medicines used during the test).    FOLLOW UP: Our staff will call the number listed on your records the next business day following your procedure.  We will call around 7:15- 8:00 am to check on you and address any questions or concerns that you may have regarding the information given to you following your procedure. If we do not reach you, we will leave a message.  If you develop any symptoms (ie: fever, flu-like symptoms, shortness of breath, cough etc.) before then, please call [phone].  If you test positive for Covid 19 in the 2 weeks post procedure, please call and report this information to [place].    If any biopsies were taken you will be contacted by phone or by letter within the next 1-3 weeks.  Please call us at [phone] if you have not heard about the biopsies in 3 weeks.    SIGNATURES/CONFIDENTIALITY: You and/or your care partner have signed paperwork which will be entered into your electronic medical record.  These signatures attest to the fact that that the information above on your After Visit Summary has been reviewed and is understood.  Full responsibility of the confidentiality of this discharge information lies  with you and/or your care-partner.  

## 2022-08-01 NOTE — Progress Notes (Signed)
Pt's states no medical or surgical changes since previsit or office visit. 

## 2022-08-02 ENCOUNTER — Telehealth: Payer: Self-pay

## 2022-08-02 NOTE — Telephone Encounter (Signed)
Left message

## 2022-08-09 ENCOUNTER — Encounter: Payer: Self-pay | Admitting: Gastroenterology

## 2022-08-23 DIAGNOSIS — M25561 Pain in right knee: Secondary | ICD-10-CM | POA: Diagnosis not present

## 2022-09-06 DIAGNOSIS — M25561 Pain in right knee: Secondary | ICD-10-CM | POA: Diagnosis not present

## 2022-09-25 DIAGNOSIS — M25561 Pain in right knee: Secondary | ICD-10-CM | POA: Diagnosis not present

## 2022-10-01 DIAGNOSIS — M25561 Pain in right knee: Secondary | ICD-10-CM | POA: Diagnosis not present

## 2023-01-13 ENCOUNTER — Encounter: Payer: Self-pay | Admitting: Family Medicine

## 2023-01-13 ENCOUNTER — Ambulatory Visit: Payer: BC Managed Care – PPO | Admitting: Family Medicine

## 2023-01-13 VITALS — BP 110/82 | HR 75 | Temp 97.7°F | Ht 65.0 in | Wt 143.6 lb

## 2023-01-13 DIAGNOSIS — H8309 Labyrinthitis, unspecified ear: Secondary | ICD-10-CM | POA: Diagnosis not present

## 2023-01-13 DIAGNOSIS — B349 Viral infection, unspecified: Secondary | ICD-10-CM | POA: Diagnosis not present

## 2023-01-13 MED ORDER — MECLIZINE HCL 25 MG PO TABS: 25.0000 mg | ORAL_TABLET | Freq: Three times a day (TID) | ORAL | 0 refills | Status: DC | PRN

## 2023-01-13 NOTE — Progress Notes (Signed)
Established Patient Office Visit   Subjective:  Patient ID: Kayla Bowman, female    DOB: Nov 30, 1971  Age: 52 y.o. MRN: [id number]  Chief Complaint  Patient presents with   Acute Visit    Head cold for 1 week, stuffy nose and runny nose on and off    HPI Encounter Diagnoses  Name Primary?   Viral syndrome Yes   Labyrinthitis, unspecified laterality    1 week history of URI signs and symptoms with nasal congestion, clear rhinorrhea, postnasal drip, ear congestion, sore throat.  Symptoms seem to clear until the last day or so when the patient developed a spinning sensation and nausea.  As it turns out she had started some doxycycline.   Review of Systems  Constitutional: Negative.  Negative for malaise/fatigue.  HENT:  Positive for congestion and sore throat.   Eyes:  Negative for blurred vision, discharge and redness.  Respiratory: Negative.    Cardiovascular: Negative.   Gastrointestinal:  Negative for abdominal pain.  Genitourinary: Negative.   Musculoskeletal: Negative.  Negative for myalgias.  Skin:  Negative for rash.  Neurological:  Positive for dizziness. Negative for tingling, loss of consciousness and weakness.  Endo/Heme/Allergies:  Negative for polydipsia.     Current Outpatient Medications:    Ascorbic Acid (VITAMIN C) 1000 MG tablet, Take 1,000 mg by mouth daily., Disp: , Rfl:    B Complex-C (B-COMPLEX WITH VITAMIN C) tablet, Take 1 tablet by mouth daily., Disp: , Rfl:    cholecalciferol (VITAMIN D3) 25 MCG (1000 UNIT) tablet, Take 1,000 Units by mouth daily., Disp: , Rfl:    meclizine (ANTIVERT) 25 MG tablet, Take 1 tablet (25 mg total) by mouth 3 (three) times daily as needed for dizziness., Disp: 30 tablet, Rfl: 0   vitamin B-12 (CYANOCOBALAMIN) 1000 MCG tablet, Take 1,000 mcg by mouth daily., Disp: , Rfl:    vitamin E 100 UNIT capsule, Take 100 Units by mouth daily., Disp: , Rfl:    Objective:     BP 110/82 (BP Location: Right Arm)   Pulse 75   Temp  97.7 F (36.5 C) (Tympanic)   Ht '5\' 5"'$  (1.651 m)   Wt 143 lb 9.6 oz (65.1 kg)   SpO2 99%   BMI 23.90 kg/m    Physical Exam Constitutional:      General: She is not in acute distress.    Appearance: Normal appearance. She is not ill-appearing, toxic-appearing or diaphoretic.  HENT:     Head: Normocephalic and atraumatic.     Right Ear: Tympanic membrane, ear canal and external ear normal.     Left Ear: Tympanic membrane, ear canal and external ear normal.     Mouth/Throat:     Mouth: Mucous membranes are moist.     Pharynx: Oropharynx is clear.  Eyes:     General: No scleral icterus.       Right eye: No discharge.        Left eye: No discharge.     Extraocular Movements: Extraocular movements intact.     Conjunctiva/sclera: Conjunctivae normal.  Cardiovascular:     Rate and Rhythm: Normal rate and regular rhythm.  Pulmonary:     Effort: Pulmonary effort is normal. No respiratory distress.     Breath sounds: Normal breath sounds. No wheezing or rales.  Musculoskeletal:     Cervical back: No rigidity or tenderness.  Lymphadenopathy:     Cervical: No cervical adenopathy.  Skin:    General: Skin is warm and dry.  Neurological:     Mental Status: She is alert and oriented to person, place, and time.  Psychiatric:        Mood and Affect: Mood normal.        Behavior: Behavior normal.      No results found for any visits on 01/13/23.    The 10-year ASCVD risk score (Arnett DK, et al., 2019) is: 0.7%    Assessment & Plan:   Viral syndrome  Labyrinthitis, unspecified laterality -     Meclizine HCl; Take 1 tablet (25 mg total) by mouth 3 (three) times daily as needed for dizziness.  Dispense: 30 tablet; Refill: 0    Return call wednesday if not improving..  Will start antibiotic Wednesday if not continuing to improve.  Libby Maw, MD

## 2023-01-23 DIAGNOSIS — Z1231 Encounter for screening mammogram for malignant neoplasm of breast: Secondary | ICD-10-CM | POA: Diagnosis not present

## 2023-01-23 DIAGNOSIS — Z6823 Body mass index (BMI) 23.0-23.9, adult: Secondary | ICD-10-CM | POA: Diagnosis not present

## 2023-01-23 DIAGNOSIS — Z01419 Encounter for gynecological examination (general) (routine) without abnormal findings: Secondary | ICD-10-CM | POA: Diagnosis not present

## 2023-01-28 ENCOUNTER — Other Ambulatory Visit: Payer: Self-pay | Admitting: Obstetrics and Gynecology

## 2023-01-28 DIAGNOSIS — R928 Other abnormal and inconclusive findings on diagnostic imaging of breast: Secondary | ICD-10-CM

## 2023-02-03 ENCOUNTER — Ambulatory Visit (INDEPENDENT_AMBULATORY_CARE_PROVIDER_SITE_OTHER): Payer: BC Managed Care – PPO | Admitting: Family Medicine

## 2023-02-03 ENCOUNTER — Encounter: Payer: Self-pay | Admitting: Family Medicine

## 2023-02-03 VITALS — BP 110/70 | HR 67 | Temp 97.3°F | Ht 65.0 in | Wt 144.0 lb

## 2023-02-03 DIAGNOSIS — Z0000 Encounter for general adult medical examination without abnormal findings: Secondary | ICD-10-CM

## 2023-02-03 NOTE — Progress Notes (Addendum)
Established Patient Office Visit   Subjective:  Patient ID: Kayla Bowman, female    DOB: 17-Dec-1970  Age: 52 y.o. MRN: [id number]  Chief Complaint  Patient presents with   Annual Exam    CPE, check toe nail on right foot gray in color x 1 year. Patient not fasting.     HPI Encounter Diagnoses  Name Primary?   Healthcare maintenance Yes   Here for yearly health check.  Up-to-date on health maintenance.  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  She has regular dental care.  She is active physically to the extent her busy schedule will allow.   Review of Systems  Constitutional: Negative.   HENT: Negative.    Eyes:  Negative for blurred vision, discharge and redness.  Respiratory: Negative.    Cardiovascular: Negative.   Gastrointestinal:  Negative for abdominal pain.  Genitourinary: Negative.   Musculoskeletal: Negative.  Negative for myalgias.  Skin:  Negative for rash.  Neurological:  Negative for tingling, loss of consciousness and weakness.  Endo/Heme/Allergies:  Negative for polydipsia.      02/03/2023   11:06 AM 02/03/2023   10:27 AM 01/13/2023    4:02 PM  Depression screen PHQ 2/9  Decreased Interest 0 0 0  Down, Depressed, Hopeless 0 0 0  PHQ - 2 Score 0 0 0  Altered sleeping 0    Tired, decreased energy 0    Change in appetite 0    Feeling bad or failure about yourself  0    Trouble concentrating 0    Moving slowly or fidgety/restless 0    Suicidal thoughts 0    PHQ-9 Score 0    Difficult doing work/chores Not difficult at all         Current Outpatient Medications:    Ascorbic Acid (VITAMIN C) 1000 MG tablet, Take 1,000 mg by mouth daily., Disp: , Rfl:    B Complex-C (B-COMPLEX WITH VITAMIN C) tablet, Take 1 tablet by mouth daily., Disp: , Rfl:    cholecalciferol (VITAMIN D3) 25 MCG (1000 UNIT) tablet, Take 1,000 Units by mouth daily., Disp: , Rfl:    vitamin B-12 (CYANOCOBALAMIN) 1000 MCG tablet, Take 1,000  mcg by mouth daily., Disp: , Rfl:    vitamin E 100 UNIT capsule, Take 100 Units by mouth daily., Disp: , Rfl:    Objective:     BP 110/70 (BP Location: Left Arm, Patient Position: Sitting, Cuff Size: Normal)   Pulse 67   Temp (!) 97.3 F (36.3 C)   Ht 5' 5"$  (1.651 m)   Wt 144 lb (65.3 kg)   LMP 01/28/2023   SpO2 99%   BMI 23.96 kg/m    Physical Exam Constitutional:      General: She is not in acute distress.    Appearance: Normal appearance. She is not ill-appearing, toxic-appearing or diaphoretic.  HENT:     Head: Normocephalic and atraumatic.     Right Ear: External ear normal.     Left Ear: External ear normal.     Mouth/Throat:     Mouth: Mucous membranes are moist.     Pharynx: Oropharynx is clear. No oropharyngeal exudate or posterior oropharyngeal erythema.  Eyes:     General: No scleral icterus.       Right eye: No discharge.        Left eye: No discharge.     Extraocular Movements: Extraocular movements intact.     Conjunctiva/sclera:  Conjunctivae normal.     Pupils: Pupils are equal, round, and reactive to light.  Cardiovascular:     Rate and Rhythm: Normal rate and regular rhythm.  Pulmonary:     Effort: Pulmonary effort is normal. No respiratory distress.     Breath sounds: Normal breath sounds.  Musculoskeletal:     Cervical back: No rigidity or tenderness.  Skin:    General: Skin is warm and dry.  Neurological:     Mental Status: She is alert and oriented to person, place, and time.  Psychiatric:        Mood and Affect: Mood normal.        Behavior: Behavior normal.      No results found for any visits on 02/03/23.    The 10-year ASCVD risk score (Arnett DK, et al., 2019) is: 0.7%    Assessment & Plan:   Healthcare maintenance -     CBC; Future -     Comprehensive metabolic panel; Future -     Lipid panel; Future -     Urinalysis, Routine w reflex microscopic; Future    Return in about 1 year (around 02/04/2024), or Please return  fasting for ordered lab work..  Information was given on health maintenance and disease prevention as well as exercising to lose weight.  Libby Maw, MD

## 2023-02-04 ENCOUNTER — Other Ambulatory Visit (INDEPENDENT_AMBULATORY_CARE_PROVIDER_SITE_OTHER): Payer: BC Managed Care – PPO

## 2023-02-04 DIAGNOSIS — Z0000 Encounter for general adult medical examination without abnormal findings: Secondary | ICD-10-CM

## 2023-02-04 LAB — LIPID PANEL
Cholesterol: 216 mg/dL — ABNORMAL HIGH (ref 0–200)
HDL: 73.7 mg/dL (ref >39.00)
LDL Cholesterol: 132 mg/dL — ABNORMAL HIGH (ref 0–99)
NonHDL: 142.67
Total CHOL/HDL Ratio: 3
Triglycerides: 54 mg/dL (ref 0.0–149.0)
VLDL: 10.8 mg/dL (ref 0.0–40.0)

## 2023-02-04 LAB — COMPREHENSIVE METABOLIC PANEL
ALT: 10 U/L (ref 0–35)
AST: 13 U/L (ref 0–37)
Albumin: 4.2 g/dL (ref 3.5–5.2)
Alkaline Phosphatase: 51 U/L (ref 39–117)
BUN: 16 mg/dL (ref 6–23)
CO2: 29 mEq/L (ref 19–32)
Calcium: 9.2 mg/dL (ref 8.4–10.5)
Chloride: 105 mEq/L (ref 96–112)
Creatinine, Ser: 0.8 mg/dL (ref 0.40–1.20)
GFR: 85.33 mL/min (ref >60.00)
Glucose, Bld: 87 mg/dL (ref 70–99)
Potassium: 4.4 mEq/L (ref 3.5–5.1)
Sodium: 140 mEq/L (ref 135–145)
Total Bilirubin: 0.7 mg/dL (ref 0.2–1.2)
Total Protein: 6.7 g/dL (ref 6.0–8.3)

## 2023-02-04 LAB — URINALYSIS, ROUTINE W REFLEX MICROSCOPIC
Bilirubin Urine: NEGATIVE
Hgb urine dipstick: NEGATIVE
Ketones, ur: NEGATIVE
Leukocytes,Ua: NEGATIVE
Nitrite: NEGATIVE
RBC / HPF: NONE SEEN (ref 0–?)
Specific Gravity, Urine: 1.025 (ref 1.000–1.030)
Total Protein, Urine: NEGATIVE
Urine Glucose: NEGATIVE
Urobilinogen, UA: 0.2 (ref 0.0–1.0)
WBC, UA: NONE SEEN (ref 0–?)
pH: 6 (ref 5.0–8.0)

## 2023-02-04 LAB — CBC
HCT: 41.7 % (ref 36.0–46.0)
Hemoglobin: 13.8 g/dL (ref 12.0–15.0)
MCHC: 33 g/dL (ref 30.0–36.0)
MCV: 90.6 fl (ref 78.0–100.0)
Platelets: 389 10*3/uL (ref 150.0–400.0)
RBC: 4.61 Mil/uL (ref 3.87–5.11)
RDW: 13.6 % (ref 11.5–15.5)
WBC: 5.7 10*3/uL (ref 4.0–10.5)

## 2023-02-04 NOTE — Progress Notes (Signed)
Pt is here for labs

## 2023-02-18 DIAGNOSIS — N9489 Other specified conditions associated with female genital organs and menstrual cycle: Secondary | ICD-10-CM | POA: Diagnosis not present

## 2023-02-26 ENCOUNTER — Telehealth: Payer: Self-pay | Admitting: Family Medicine

## 2023-02-26 NOTE — Telephone Encounter (Signed)
Patient dropped off document  medical release , to be filled out by provider. Patient requested to send it via Call Patient to pick up within 5-days. Document is located in providers tray at front office.Please advise at Mobile [phone] (mobile)

## 2023-02-27 DIAGNOSIS — L710 Perioral dermatitis: Secondary | ICD-10-CM | POA: Diagnosis not present

## 2023-02-27 DIAGNOSIS — L821 Other seborrheic keratosis: Secondary | ICD-10-CM | POA: Diagnosis not present

## 2023-02-27 DIAGNOSIS — D225 Melanocytic nevi of trunk: Secondary | ICD-10-CM | POA: Diagnosis not present

## 2023-02-27 DIAGNOSIS — L814 Other melanin hyperpigmentation: Secondary | ICD-10-CM | POA: Diagnosis not present

## 2023-03-03 NOTE — Telephone Encounter (Signed)
Form viewed, signed and picked up by patient.

## 2023-03-18 ENCOUNTER — Ambulatory Visit: Payer: BC Managed Care – PPO | Admitting: Nurse Practitioner

## 2023-03-18 ENCOUNTER — Encounter: Payer: Self-pay | Admitting: Nurse Practitioner

## 2023-03-18 VITALS — BP 120/84 | HR 60 | Temp 97.8°F | Resp 16 | Ht 65.0 in | Wt 144.0 lb

## 2023-03-18 DIAGNOSIS — R300 Dysuria: Secondary | ICD-10-CM | POA: Diagnosis not present

## 2023-03-18 LAB — POC URINALSYSI DIPSTICK (AUTOMATED)
Bilirubin, UA: NEGATIVE
Blood, UA: 10
Glucose, UA: NEGATIVE
Ketones, UA: 0
Nitrite, UA: NEGATIVE
Protein, UA: POSITIVE — AB
Spec Grav, UA: 1.025 (ref 1.010–1.025)
Urobilinogen, UA: 0.2 E.U./dL — AB
pH, UA: 6 (ref 5.0–8.0)

## 2023-03-18 NOTE — Patient Instructions (Signed)
Increase oral hydration with water Avoid soda, juice, alcohol and caffeine Urine sent for culture Take AZO over the counter as needed for burning during urination

## 2023-03-18 NOTE — Progress Notes (Signed)
Acute Office Visit  Subjective:    Patient ID: Kayla Bowman, female    DOB: 07-Mar-1971, 52 y.o.   MRN: [id number]  Chief Complaint  Patient presents with   Urinary Tract Infection    Burning and frequency    Urinary Tract Infection  This is a new problem. The current episode started yesterday. The problem occurs every urination. The problem has been unchanged. The quality of the pain is described as burning. There has been no fever. She is Sexually active. There is No history of pyelonephritis. Associated symptoms include frequency. Pertinent negatives include no chills, discharge, flank pain, hematuria, hesitancy, nausea, possible pregnancy, sweats, urgency or vomiting. She has tried nothing for the symptoms. There is no history of catheterization, kidney stones, recurrent UTIs, a single kidney, urinary stasis or a urological procedure.   Outpatient Medications Prior to Visit  Medication Sig   Ascorbic Acid (VITAMIN C) 1000 MG tablet Take 1,000 mg by mouth daily.   B Complex-C (B-COMPLEX WITH VITAMIN C) tablet Take 1 tablet by mouth daily.   cholecalciferol (VITAMIN D3) 25 MCG (1000 UNIT) tablet Take 1,000 Units by mouth daily.   medroxyPROGESTERone (PROVERA) 10 MG tablet TAKE ONE TABLET BY MOUTH EVERY NIGHT FOR 10 NIGHTS AS NEEDED EVERY 35 DAYS NO CYCLE   vitamin B-12 (CYANOCOBALAMIN) 1000 MCG tablet Take 1,000 mcg by mouth daily.   vitamin E 100 UNIT capsule Take 100 Units by mouth daily.   No facility-administered medications prior to visit.   Reviewed past medical and social history.  Review of Systems  Constitutional:  Negative for chills.  Gastrointestinal:  Negative for nausea and vomiting.  Genitourinary:  Positive for frequency. Negative for flank pain, hematuria, hesitancy and urgency.      Objective:    Physical Exam Vitals reviewed.  Constitutional:      General: She is not in acute distress. Abdominal:     General: Bowel sounds are normal. There is no  distension.     Palpations: Abdomen is soft.     Tenderness: There is no abdominal tenderness. There is no right CVA tenderness, left CVA tenderness or guarding.  Neurological:     Mental Status: She is alert and oriented to person, place, and time.     BP 120/84 (BP Location: Left Arm, Patient Position: Sitting, Cuff Size: Normal)   Pulse 60   Temp 97.8 F (36.6 C) (Temporal)   Resp 16   Ht 5\' 5"  (1.651 m)   Wt 144 lb (65.3 kg)   LMP 02/20/2023   SpO2 99%   BMI 23.96 kg/m    Results for orders placed or performed in visit on 03/18/23  POCT Urinalysis Dipstick (Automated)  Result Value Ref Range   Color, UA Yellow    Clarity, UA Clear    Glucose, UA Negative Negative   Bilirubin, UA neg    Ketones, UA 0    Spec Grav, UA 1.025 1.010 - 1.025   Blood, UA 10    pH, UA 6.0 5.0 - 8.0   Protein, UA Positive (A) Negative   Urobilinogen, UA 0.2 (A) 0.2 or 1.0 E.U./dL   Nitrite, UA neg    Leukocytes, UA Moderate (2+) (A) Negative      Assessment & Plan:   Problem List Items Addressed This Visit   None Visit Diagnoses     Dysuria    -  Primary   Relevant Orders   POCT Urinalysis Dipstick (Automated) (Completed)   Urine Culture  Increase oral hydration with water Avoid soda, juice, alcohol and caffeine Urine sent for culture Take AZO over the counter as needed for burning during urination  No orders of the defined types were placed in this encounter.  Return if symptoms worsen or fail to improve.    Wilfred Lacy, NP

## 2023-03-19 ENCOUNTER — Telehealth: Payer: Self-pay | Admitting: Family Medicine

## 2023-03-19 DIAGNOSIS — R300 Dysuria: Secondary | ICD-10-CM

## 2023-03-19 MED ORDER — SULFAMETHOXAZOLE-TRIMETHOPRIM 800-160 MG PO TABS: 1.0000 | ORAL_TABLET | Freq: Two times a day (BID) | ORAL | 0 refills | Status: DC

## 2023-03-19 NOTE — Telephone Encounter (Signed)
Pt called back and call was transferred to me.  She is complaining of hematuria and dysuria, She has not been able to sleep at night due to the pain.  Right now the pain is in the pelvic area.   An abx will be sent when we get the final results of her urine culture.

## 2023-03-19 NOTE — Telephone Encounter (Signed)
Pt states she saw Wilfred Lacy on 03/18/23 for a UTI. States she noticed some blood in urine this morning. Pt requests antibiotics be sent in asap.

## 2023-03-19 NOTE — Addendum Note (Signed)
Addended by: Leana Gamer on: 03/19/2023 03:22 PM   Modules accepted: Orders

## 2023-03-21 ENCOUNTER — Other Ambulatory Visit: Payer: Self-pay | Admitting: Nurse Practitioner

## 2023-03-21 DIAGNOSIS — R300 Dysuria: Secondary | ICD-10-CM

## 2023-03-21 LAB — URINE CULTURE
MICRO NUMBER:: 14771945
SPECIMEN QUALITY:: ADEQUATE

## 2023-03-21 MED ORDER — SULFAMETHOXAZOLE-TRIMETHOPRIM 800-160 MG PO TABS: 1.0000 | ORAL_TABLET | Freq: Two times a day (BID) | ORAL | 0 refills | Status: AC

## 2023-04-23 ENCOUNTER — Encounter: Payer: Self-pay | Admitting: Family Medicine

## 2023-04-23 ENCOUNTER — Ambulatory Visit: Payer: BC Managed Care – PPO | Admitting: Family Medicine

## 2023-04-23 VITALS — BP 104/66 | HR 78 | Temp 98.1°F | Ht 65.0 in | Wt 139.0 lb

## 2023-04-23 DIAGNOSIS — G4733 Obstructive sleep apnea (adult) (pediatric): Secondary | ICD-10-CM

## 2023-04-23 NOTE — Progress Notes (Signed)
Established Patient Office Visit   Subjective:  Patient ID: Kayla Bowman, female    DOB: [birth year]  Age: 52 y.o. MRN: [id number]  Chief Complaint  Patient presents with   Snoring    Concerns about snoring and jawbone pains from sleeping mouth piece.     HPI Encounter Diagnoses  Name Primary?   Obstructive sleep apnea syndrome Yes   Recently diagnosed with sleep apnea per dentist.  She has a history of bruxism.  She has had difficulty using a dental device for both her bruxism and apnea.  Husband says it is not working well.  She has pain anterior to her ears.  Traveling to [place] on Friday   Review of Systems  Constitutional: Negative.   HENT:  Positive for ear pain. Negative for hearing loss.   Eyes:  Negative for blurred vision, discharge and redness.  Respiratory: Negative.    Cardiovascular: Negative.   Gastrointestinal:  Negative for abdominal pain.  Genitourinary: Negative.   Musculoskeletal: Negative.  Negative for myalgias.  Skin:  Negative for rash.  Neurological:  Negative for tingling, loss of consciousness and weakness.  Endo/Heme/Allergies:  Negative for polydipsia.     Current Outpatient Medications:    Ascorbic Acid (VITAMIN C) 1000 MG tablet, Take 1,000 mg by mouth daily., Disp: , Rfl:    B Complex-C (B-COMPLEX WITH VITAMIN C) tablet, Take 1 tablet by mouth daily., Disp: , Rfl:    cholecalciferol (VITAMIN D3) 25 MCG (1000 UNIT) tablet, Take 1,000 Units by mouth daily., Disp: , Rfl:    vitamin B-12 (CYANOCOBALAMIN) 1000 MCG tablet, Take 1,000 mcg by mouth daily., Disp: , Rfl:    vitamin E 100 UNIT capsule, Take 100 Units by mouth daily., Disp: , Rfl:    Objective:     BP 104/66 (BP Location: Left Arm, Patient Position: Sitting, Cuff Size: Normal)   Pulse 78   Temp 98.1 F (36.7 C) (Temporal)   Ht 5\' 5"  (1.651 m)   Wt 139 lb (63 kg)   SpO2 96%   BMI 23.13 kg/m    Physical Exam Constitutional:      General: She is not in acute distress.     Appearance: Normal appearance. She is not ill-appearing, toxic-appearing or diaphoretic.  HENT:     Head: Normocephalic and atraumatic.     Right Ear: Tympanic membrane, ear canal and external ear normal.     Left Ear: Tympanic membrane, ear canal and external ear normal.     Mouth/Throat:     Comments: Mallampati of 4 Eyes:     General: No scleral icterus.       Right eye: No discharge.        Left eye: No discharge.     Extraocular Movements: Extraocular movements intact.     Conjunctiva/sclera: Conjunctivae normal.  Pulmonary:     Effort: Pulmonary effort is normal. No respiratory distress.  Skin:    General: Skin is warm and dry.  Neurological:     Mental Status: She is alert and oriented to person, place, and time.  Psychiatric:        Mood and Affect: Mood normal.        Behavior: Behavior normal.      No results found for any visits on 04/23/23.    The 10-year ASCVD risk score (Arnett DK, et al., 2019) is: 0.7%    Assessment & Plan:   Obstructive sleep apnea syndrome -     Ambulatory referral to Pulmonology  She is not excited about using a CPAP machine but believes that she could use the nasal cannula mask.  No follow-ups on file.    Mliss Sax, MD

## 2023-05-19 ENCOUNTER — Encounter: Payer: Self-pay | Admitting: Adult Health

## 2023-05-19 ENCOUNTER — Ambulatory Visit (INDEPENDENT_AMBULATORY_CARE_PROVIDER_SITE_OTHER): Payer: BC Managed Care – PPO | Admitting: Adult Health

## 2023-05-19 VITALS — BP 100/70 | HR 71 | Ht 65.0 in | Wt 137.2 lb

## 2023-05-19 DIAGNOSIS — G4733 Obstructive sleep apnea (adult) (pediatric): Secondary | ICD-10-CM | POA: Insufficient documentation

## 2023-05-19 NOTE — Progress Notes (Signed)
@Patient  ID: Kayla Bowman, female    DOB: [birth year], 52 y.o.   MRN: [id number]  Chief Complaint  Patient presents with   Consult    Referring provider: Mliss Sax,*  HPI: Sleep consult May 19, 2023 to establish for sleep apnea  TEST/EVENTS :   05/19/2023 Sleep consult  Patient presents for sleep consult today to establish for sleep apnea.  Kindly referred by primary care provider Dr. Doreene Burke.  Patient says she was diagnosed with sleep apnea about 3 years ago.  Had a home sleep study July 28, 2020 that showed mild sleep apnea with AHI at 12.0/hour.  Patient says she was fitted for an oral appliance but continues to have ongoing snoring and restless sleep.  Wakes up feeling tired.  Spouse says she snores very loudly despite wearing the oral appliance. Typically goes to sleep about 9 PM.  Takes up to 30 minutes to go to sleep.  Is up multiple times throughout the night.  Gets up at 6:30 AM.  Weights been up 10 pounds over the last 2 years.  Current weight is at 137 pounds with a BMI 22.  Got a oral appliance through dental care of [place].  No symptoms suspicious for cataplexy or sleep paralysis.  Does not use any sleep aids.  Caffeine intake is minimum.  No removable dental work.  No history of congestive heart failure or stroke.  Epworth score is 2 out of 24.  Typically gets sleepy in the afternoon hours. Does not nap.  Patient was recently on a volunteer trip to [place] for humanitarian aid.  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  Social history patient is married.  Lives with her husband.  Works as a Emergency planning/management officer.  Does not smoke.  No alcohol or drug use.  No children.  Allergies  Allergen Reactions   Iodine Itching    Immunization History  Administered Date(s) Administered   Tdap 02/01/2022   Unspecified SARS-COV-2 Vaccination 03/20/2020, 04/18/2020    No past medical history on file.  Tobacco History: Social History   Tobacco Use  Smoking Status Never   Smokeless Tobacco Never   Counseling given: Not Answered   Outpatient Medications Prior to Visit  Medication Sig Dispense Refill   Ascorbic Acid (VITAMIN C) 1000 MG tablet Take 1,000 mg by mouth daily.     B Complex-C (B-COMPLEX WITH VITAMIN C) tablet Take 1 tablet by mouth daily.     cholecalciferol (VITAMIN D3) 25 MCG (1000 UNIT) tablet Take 1,000 Units by mouth daily.     vitamin B-12 (CYANOCOBALAMIN) 1000 MCG tablet Take 1,000 mcg by mouth daily.     vitamin E 100 UNIT capsule Take 100 Units by mouth daily.     No facility-administered medications prior to visit.     Review of Systems:   Constitutional:   No  weight loss, night sweats,  Fevers, chills,  +fatigue, or  lassitude.  HEENT:   No headaches,  Difficulty swallowing,  Tooth/dental problems, or  Sore throat,                No sneezing, itching, ear ache, nasal congestion, post nasal drip,   CV:  No chest pain,  Orthopnea, PND, swelling in lower extremities, anasarca, dizziness, palpitations, syncope.   GI  No heartburn, indigestion, abdominal pain, nausea, vomiting, diarrhea, change in bowel habits, loss of appetite, bloody stools.   Resp: No shortness of breath with exertion or at rest.  No excess mucus, no productive  cough,  No non-productive cough,  No coughing up of blood.  No change in color of mucus.  No wheezing.  No chest wall deformity  Skin: no rash or lesions.  GU: no dysuria, change in color of urine, no urgency or frequency.  No flank pain, no hematuria   MS:  No joint pain or swelling.  No decreased range of motion.  No back pain.    Physical Exam  BP 100/70 (BP Location: Left Arm, Patient Position: Sitting, Cuff Size: Normal)   Pulse 71   Ht 5\' 5"  (1.651 m)   Wt 137 lb 3.2 oz (62.2 kg)   SpO2 98%   BMI 22.83 kg/m   GEN: A/Ox3; pleasant , NAD, well nourished    HEENT:  [place]/AT,   NOSE-clear, THROAT-clear, no lesions, no postnasal drip or exudate noted. Class 3 MP airway   NECK:  Supple w/  fair ROM; no JVD; normal carotid impulses w/o bruits; no thyromegaly or nodules palpated; no lymphadenopathy.    RESP  Clear  P & A; w/o, wheezes/ rales/ or rhonchi. no accessory muscle use, no dullness to percussion  CARD:  RRR, no m/r/g, no peripheral edema, pulses intact, no cyanosis or clubbing.  GI:   Soft & nt; nml bowel sounds; no organomegaly or masses detected.   Musco: Warm bil, no deformities or joint swelling noted.   Neuro: alert, no focal deficits noted.    Skin: Warm, no lesions or rashes    Lab Results:    BMET   BNP No results found for: "BNP"  ProBNP No results found for: "PROBNP"  Imaging: No results found.        No data to display          No results found for: "NITRICOXIDE"      Assessment & Plan:   OSA (obstructive sleep apnea) Mild obstructive sleep apnea without significant improvement with oral appliance.  Patient has ongoing snoring, restless sleep and daytime sleepiness.  Previous sleep study August 2021 showed mild obstructive sleep apnea with AHI at 12.0/hour.  Patient has had no significant improvement with oral device.  Since sleep study is greater than 40 years old we will repeat home sleep study.  If continues to have ongoing sleep apnea would recommend CPAP therapy.  Continue with positional sleep.  Set up for home sleep study   Plan  Patient Instructions   Set up for home sleep study Work on healthy sleep regimen Do not drive if sleepy Follow-up in 6 weeks with in person visit or virtual visit to discuss sleep study results and treatment plan.       Rubye Oaks, NP 05/19/2023

## 2023-05-19 NOTE — Patient Instructions (Addendum)
Set up for home sleep study Work on healthy sleep regimen Do not drive if sleepy Follow-up in 6 weeks with in person visit or virtual visit to discuss sleep study results and treatment plan.

## 2023-05-19 NOTE — Assessment & Plan Note (Signed)
Mild obstructive sleep apnea without significant improvement with oral appliance.  Patient has ongoing snoring, restless sleep and daytime sleepiness.  Previous sleep study August 2021 showed mild obstructive sleep apnea with AHI at 12.0/hour.  Patient has had no significant improvement with oral device.  Since sleep study is greater than 52 years old we will repeat home sleep study.  If continues to have ongoing sleep apnea would recommend CPAP therapy.  Continue with positional sleep.  Set up for home sleep study   Plan  Patient Instructions   Set up for home sleep study Work on healthy sleep regimen Do not drive if sleepy Follow-up in 6 weeks with in person visit or virtual visit to discuss sleep study results and treatment plan.

## 2023-05-19 NOTE — Progress Notes (Signed)
Reviewed and agree with assessment/plan.   Coralyn Helling, MD [facility] Pulmonary/Critical Care 05/19/2023, 2:21 PM Pager:  [phone]

## 2023-06-13 ENCOUNTER — Encounter (INDEPENDENT_AMBULATORY_CARE_PROVIDER_SITE_OTHER): Payer: BC Managed Care – PPO

## 2023-06-13 ENCOUNTER — Telehealth: Payer: Self-pay | Admitting: Pulmonary Disease

## 2023-06-13 DIAGNOSIS — G4733 Obstructive sleep apnea (adult) (pediatric): Secondary | ICD-10-CM

## 2023-06-13 NOTE — Telephone Encounter (Signed)
Call patient  Sleep study result  Date of study: 05/30/2023  Impression: Mild obstructive sleep apnea with mild oxygen desaturations  Recommendation: Options of treatment for mild obstructive sleep apnea will include  1.  CPAP therapy if there is significant daytime sleepiness or other comorbidities including history of CVA or cardiac disease  -If CPAP is chosen as an option of treatment auto titrating CPAP with a pressure setting of 5-15 will be appropriate  2.  Watchful waiting with emphasis on weight loss measures, sleep position modification to optimize lateral sleep, elevating the head of the bed by about 30 degrees may also help.  3.  An oral device may be fashioned for the treatment of mild sleep disordered breathing, will involve referral to dentist.   Follow-up as previously scheduled

## 2023-06-16 NOTE — Telephone Encounter (Signed)
Has ov with me later this month to discuss results

## 2023-07-08 ENCOUNTER — Encounter: Payer: Self-pay | Admitting: Adult Health

## 2023-07-08 ENCOUNTER — Ambulatory Visit: Payer: BC Managed Care – PPO | Admitting: Adult Health

## 2023-07-08 VITALS — BP 104/64 | HR 82 | Ht 65.0 in | Wt 139.6 lb

## 2023-07-08 DIAGNOSIS — G4733 Obstructive sleep apnea (adult) (pediatric): Secondary | ICD-10-CM

## 2023-07-08 NOTE — Progress Notes (Signed)
Reviewed and agree with assessment/plan.   Coralyn Helling, MD [facility] Pulmonary/Critical Care 07/08/2023, 12:03 PM Pager:  [phone]

## 2023-07-08 NOTE — Progress Notes (Addendum)
@Patient  ID: Kayla Bowman, female    DOB: [birth year], 52 y.o.   MRN: [id number]  Chief Complaint  Patient presents with   Follow-up    Referring provider: Mliss Sax  HPI: 52 year old female seen for sleep consultation. June 2024 to establish for sleep apnea  TEST/EVENTS :  Home sleep Study 05/30/2023 mild sleep apnea with AHI at 9.1/hr, SpO2 85%  07/08/2023 Follow up : OSA  Patient presents for a 6-week follow-up.  Patient was seen last visit for sleep consult to establish for sleep apnea.  Patient initially was diagnosed with sleep apnea in August 2021 found to have mild sleep apnea.  She initially tried an oral appliance but did not feel like she benefited from that continue to snore and wake up tired.  She was set up for repeat home sleep study that was done on May 30, 2023 that showed mild sleep apnea with AHI at 9.9/hour, SpO2 low 85%. We discussed her sleep study results went over treatment options including CPAP therapy. Wants to use a nasal mask .     Allergies  Allergen Reactions   Iodine Itching    Immunization History  Administered Date(s) Administered   Tdap 02/01/2022   Unspecified SARS-COV-2 Vaccination 03/20/2020, 04/18/2020    No past medical history on file.  Tobacco History: Social History   Tobacco Use  Smoking Status Never  Smokeless Tobacco Never   Counseling given: Not Answered   Outpatient Medications Prior to Visit  Medication Sig Dispense Refill   Ascorbic Acid (VITAMIN C) 1000 MG tablet Take 1,000 mg by mouth daily.     B Complex-C (B-COMPLEX WITH VITAMIN C) tablet Take 1 tablet by mouth daily.     cholecalciferol (VITAMIN D3) 25 MCG (1000 UNIT) tablet Take 1,000 Units by mouth daily.     vitamin B-12 (CYANOCOBALAMIN) 1000 MCG tablet Take 1,000 mcg by mouth daily.     vitamin E 100 UNIT capsule Take 100 Units by mouth daily.     No facility-administered medications prior to visit.     Review of Systems:    Constitutional:   No  weight loss, night sweats,  Fevers, chills, fatigue, or  lassitude.  HEENT:   No headaches,  Difficulty swallowing,  Tooth/dental problems, or  Sore throat,                No sneezing, itching, ear ache, nasal congestion, post nasal drip,   CV:  No chest pain,  Orthopnea, PND, swelling in lower extremities, anasarca, dizziness, palpitations, syncope.   GI  No heartburn, indigestion, abdominal pain, nausea, vomiting, diarrhea, change in bowel habits, loss of appetite, bloody stools.   Resp: No shortness of breath with exertion or at rest.  No excess mucus, no productive cough,  No non-productive cough,  No coughing up of blood.  No change in color of mucus.  No wheezing.  No chest wall deformity  Skin: no rash or lesions.  GU: no dysuria, change in color of urine, no urgency or frequency.  No flank pain, no hematuria   MS:  No joint pain or swelling.  No decreased range of motion.  No back pain.    Physical Exam  BP 104/64 (BP Location: Left Arm, Patient Position: Sitting, Cuff Size: Normal)   Pulse 82   Ht 5\' 5"  (1.651 m)   Wt 139 lb 9.6 oz (63.3 kg)   SpO2 100%   BMI 23.23 kg/m   GEN: A/Ox3; pleasant , NAD, well nourished  HEENT:  [place]/AT,  NOSE-clear, THROAT-clear, no lesions, no postnasal drip or exudate noted. Class 3 MP airway   NECK:  Supple w/ fair ROM; no JVD; normal carotid impulses w/o bruits; no thyromegaly or nodules palpated; no lymphadenopathy.    RESP  Clear  P & A; w/o, wheezes/ rales/ or rhonchi. no accessory muscle use, no dullness to percussion  CARD:  RRR, no m/r/g, no peripheral edema, pulses intact, no cyanosis or clubbing.  GI:   Soft & nt; nml bowel sounds; no organomegaly or masses detected.   Musco: Warm bil, no deformities or joint swelling noted.   Neuro: alert, no focal deficits noted.    Skin: Warm, no lesions or rashes    Lab Results:  CBC    BNP No results found for: "BNP"  ProBNP No results found  for: "PROBNP"  Imaging: No results found.  Administration History     None           No data to display          No results found for: "NITRICOXIDE"      Assessment & Plan:   OSA (obstructive sleep apnea) Mild obstructive sleep apnea with significant symptom burden daytime sleepiness and restless sleep.  Set patient up for CPAP therapy.  Will begin auto CPAP 5 to 15 cm H2O.  She was to try a nasal mask.  Will send an order for a small DreamWear nasal mask.  - discussed how weight can impact sleep and risk for sleep disordered breathing - discussed options to assist with weight loss: combination of diet modification, cardiovascular and strength training exercises   - had an extensive discussion regarding the adverse health consequences related to untreated sleep disordered breathing - specifically discussed the risks for hypertension, coronary artery disease, cardiac dysrhythmias, cerebrovascular disease, and diabetes - lifestyle modification discussed   - discussed how sleep disruption can increase risk of accidents, particularly when driving - safe driving practices were discussed    Plan  Patient Instructions  Begin CPAP At bedtime, wear all night long for at least 6hrs  Work on healthy sleep regimen Do not drive if sleepy Saline nasal spray Twice daily   Saline nasal gel At bedtime   Follow-up in 3 months and As needed         Rubye Oaks, NP 07/08/2023

## 2023-07-08 NOTE — Patient Instructions (Addendum)
Begin CPAP At bedtime, wear all night long for at least 6hrs  Work on healthy sleep regimen Do not drive if sleepy Saline nasal spray Twice daily   Saline nasal gel At bedtime   Follow-up in 3 months and As needed

## 2023-07-08 NOTE — Assessment & Plan Note (Signed)
Mild obstructive sleep apnea with significant symptom burden daytime sleepiness and restless sleep.  Set patient up for CPAP therapy.  Will begin auto CPAP 5 to 15 cm H2O.  She was to try a nasal mask.  Will send an order for a small DreamWear nasal mask.  - discussed how weight can impact sleep and risk for sleep disordered breathing - discussed options to assist with weight loss: combination of diet modification, cardiovascular and strength training exercises   - had an extensive discussion regarding the adverse health consequences related to untreated sleep disordered breathing - specifically discussed the risks for hypertension, coronary artery disease, cardiac dysrhythmias, cerebrovascular disease, and diabetes - lifestyle modification discussed   - discussed how sleep disruption can increase risk of accidents, particularly when driving - safe driving practices were discussed    Plan  Patient Instructions  Begin CPAP At bedtime, wear all night long for at least 6hrs  Work on healthy sleep regimen Do not drive if sleepy Saline nasal spray Twice daily   Saline nasal gel At bedtime   Follow-up in 3 months and As needed

## 2023-07-18 DIAGNOSIS — G4733 Obstructive sleep apnea (adult) (pediatric): Secondary | ICD-10-CM | POA: Diagnosis not present

## 2023-07-30 DIAGNOSIS — G4733 Obstructive sleep apnea (adult) (pediatric): Secondary | ICD-10-CM | POA: Diagnosis not present

## 2023-08-18 DIAGNOSIS — G4733 Obstructive sleep apnea (adult) (pediatric): Secondary | ICD-10-CM | POA: Diagnosis not present

## 2023-08-30 DIAGNOSIS — G4733 Obstructive sleep apnea (adult) (pediatric): Secondary | ICD-10-CM | POA: Diagnosis not present

## 2023-09-16 ENCOUNTER — Encounter: Payer: Self-pay | Admitting: Adult Health

## 2023-09-16 ENCOUNTER — Ambulatory Visit: Payer: BC Managed Care – PPO | Admitting: Adult Health

## 2023-09-16 VITALS — BP 90/58 | HR 60 | Ht 65.0 in | Wt 137.6 lb

## 2023-09-16 DIAGNOSIS — G4733 Obstructive sleep apnea (adult) (pediatric): Secondary | ICD-10-CM | POA: Diagnosis not present

## 2023-09-16 NOTE — Progress Notes (Addendum)
 @Patient  ID: Kayla Bowman, female    DOB: 1971-05-28, 52 y.o.   MRN: [id number]  Chief Complaint  Patient presents with   Follow-up    Referring provider: Mliss Sax  HPI: 52 year old female seen for sleep consult June 2024 to establish for sleep apnea  TEST/EVENTS :  Home sleep Study 05/30/2023 mild sleep apnea with AHI at 9.1/hr, SpO2 85%    09/16/2023 Follow up : OSA  Patient presents for a follow-up visit for sleep apnea.  Patient had a sleep study May 30, 2023 that showed mild sleep apnea with AHI at 9.9/hour and SpO2 low at 85%.  She was started on CPAP therapy last visit.  Since last visit patient says she is feeling better .  She does feel that the CPAP has helped she does not feel quite as tired.  She just says that throughout the night she ends up taking it off and cannot go back to sleep with it on in the middle the night.  She is currently using nasal pillows feels that this is the most comfortable.  Does sometimes irritate her nose.  We talked about using some CPAP mask liner.    Allergies  Allergen Reactions   Iodine Itching    Immunization History  Administered Date(s) Administered   Tdap 02/01/2022   Unspecified SARS-COV-2 Vaccination 03/20/2020, 04/18/2020    History reviewed. No pertinent past medical history.  Tobacco History: Social History   Tobacco Use  Smoking Status Never  Smokeless Tobacco Never   Counseling given: Not Answered   Outpatient Medications Prior to Visit  Medication Sig Dispense Refill   Ascorbic Acid (VITAMIN C) 1000 MG tablet Take 1,000 mg by mouth daily.     B Complex-C (B-COMPLEX WITH VITAMIN C) tablet Take 1 tablet by mouth daily.     cholecalciferol (VITAMIN D3) 25 MCG (1000 UNIT) tablet Take 1,000 Units by mouth daily.     vitamin B-12 (CYANOCOBALAMIN) 1000 MCG tablet Take 1,000 mcg by mouth daily.     vitamin E 100 UNIT capsule Take 100 Units by mouth daily.     No facility-administered medications prior  to visit.     Review of Systems:   Constitutional:   No  weight loss, night sweats,  Fevers, chills, fatigue, or  lassitude.  HEENT:   No headaches,  Difficulty swallowing,  Tooth/dental problems, or  Sore throat,                No sneezing, itching, ear ache, nasal congestion, post nasal drip,   CV:  No chest pain,  Orthopnea, PND, swelling in lower extremities, anasarca, dizziness, palpitations, syncope.   GI  No heartburn, indigestion, abdominal pain, nausea, vomiting, diarrhea, change in bowel habits, loss of appetite, bloody stools.   Resp: No shortness of breath with exertion or at rest.  No excess mucus, no productive cough,  No non-productive cough,  No coughing up of blood.  No change in color of mucus.  No wheezing.  No chest wall deformity  Skin: no rash or lesions.  GU: no dysuria, change in color of urine, no urgency or frequency.  No flank pain, no hematuria   MS:  No joint pain or swelling.  No decreased range of motion.  No back pain.    Physical Exam  BP (!) 90/58 (BP Location: Left Arm, Patient Position: Sitting, Cuff Size: Normal)   Pulse 60   Ht 5\' 5"  (1.651 m)   Wt 137 lb 9.6 oz (62.4  kg)   SpO2 100%   BMI 22.90 kg/m   GEN: A/Ox3; pleasant , NAD, well nourished    HEENT:  [place]/AT,  NOSE-clear, THROAT-clear, no lesions, no postnasal drip or exudate noted.   NECK:  Supple w/ fair ROM; no JVD; normal carotid impulses w/o bruits; no thyromegaly or nodules palpated; no lymphadenopathy.    RESP  Clear  P & A; w/o, wheezes/ rales/ or rhonchi. no accessory muscle use, no dullness to percussion  CARD:  RRR, no m/r/g, no peripheral edema, pulses intact, no cyanosis or clubbing.  GI:   Soft & nt; nml bowel sounds; no organomegaly or masses detected.   Musco: Warm bil, no deformities or joint swelling noted.   Neuro: alert, no focal deficits noted.    Skin: Warm, no lesions or rashes    Lab Results:   BNP No results found for: "BNP"  ProBNP No  results found for: "PROBNP"  Imaging: No results found.  Administration History     None           No data to display          No results found for: "NITRICOXIDE"      Assessment & Plan:   OSA (obstructive sleep apnea) Patient has excellent control and perceived benefit on CPAP.  Daily average usage at 3. hours.  Usage at 93%.  Patient is on auto CPAP 5 to 15 cm H2O.  Daily average pressure at 7.6 on remains H2O.  AHI 0.6/hour.  Encouraged on increasing daily usage.  Plan  Patient Instructions  CPAP At bedtime, wear all night long for at least 6hrs  Work on healthy sleep regimen Do not drive if sleepy Saline nasal spray Twice daily   Saline nasal gel At bedtime   Follow-up in 6 months and As needed         Rubye Oaks, NP 09/16/2023

## 2023-09-16 NOTE — Patient Instructions (Addendum)
CPAP At bedtime, wear all night long for at least 6hrs  Work on healthy sleep regimen Do not drive if sleepy Saline nasal spray Twice daily   Saline nasal gel At bedtime   Follow-up in 6 months and As needed

## 2023-09-16 NOTE — Assessment & Plan Note (Signed)
Patient has excellent control and perceived benefit on CPAP.  Daily average usage at 3. hours.  Usage at 93%.  Patient is on auto CPAP 5 to 15 cm H2O.  Daily average pressure at 7.6 on remains H2O.  AHI 0.6/hour.  Encouraged on increasing daily usage.  Plan  Patient Instructions  CPAP At bedtime, wear all night long for at least 6hrs  Work on healthy sleep regimen Do not drive if sleepy Saline nasal spray Twice daily   Saline nasal gel At bedtime   Follow-up in 6 months and As needed

## 2023-09-17 DIAGNOSIS — G4733 Obstructive sleep apnea (adult) (pediatric): Secondary | ICD-10-CM | POA: Diagnosis not present

## 2023-09-29 DIAGNOSIS — G4733 Obstructive sleep apnea (adult) (pediatric): Secondary | ICD-10-CM | POA: Diagnosis not present

## 2023-10-16 DIAGNOSIS — G4733 Obstructive sleep apnea (adult) (pediatric): Secondary | ICD-10-CM | POA: Diagnosis not present

## 2023-10-18 DIAGNOSIS — G4733 Obstructive sleep apnea (adult) (pediatric): Secondary | ICD-10-CM | POA: Diagnosis not present

## 2023-12-11 ENCOUNTER — Ambulatory Visit: Payer: BC Managed Care – PPO | Admitting: Family Medicine

## 2023-12-11 VITALS — BP 118/70 | HR 86 | Temp 100.5°F | Ht 65.0 in | Wt 136.4 lb

## 2023-12-11 DIAGNOSIS — R6889 Other general symptoms and signs: Secondary | ICD-10-CM | POA: Diagnosis not present

## 2023-12-11 DIAGNOSIS — U071 COVID-19: Secondary | ICD-10-CM | POA: Diagnosis not present

## 2023-12-11 LAB — POCT INFLUENZA A/B
Influenza A, POC: NEGATIVE
Influenza B, POC: NEGATIVE

## 2023-12-11 LAB — POC COVID19 BINAXNOW: SARS Coronavirus 2 Ag: POSITIVE — AB

## 2023-12-11 MED ORDER — NIRMATRELVIR/RITONAVIR (PAXLOVID)TABLET
3.0000 | ORAL_TABLET | Freq: Two times a day (BID) | ORAL | 0 refills | Status: AC
Start: 2023-12-11 — End: 2023-12-16

## 2023-12-11 NOTE — Progress Notes (Signed)
Established Patient Office Visit   Subjective:  Patient ID: Kayla Bowman, female    DOB: 15-Aug-1971  Age: 52 y.o. MRN: [id number]  Chief Complaint  Patient presents with   Nasal Congestion    C/o having nasal congestion/sinus pressure, HA, itchy ears, low back pain x 2 days.  Has taken Elder Allyson Sabal, Echinacea, Tylenol and Baby Asprin.     HPI Encounter Diagnoses  Name Primary?   Flu-like symptoms Yes   COVID    2-day history of fevers chills with headache, nasal congestion postnasal drip sore throat cough, myalgias.  Denies wheezing or difficulty breathing.  No history of asthma or tobacco use.  "My skin hurts".   Review of Systems  Constitutional:  Positive for chills, fever and malaise/fatigue.  HENT:  Positive for congestion and sore throat.   Eyes:  Negative for blurred vision, discharge and redness.  Respiratory:  Positive for cough. Negative for sputum production, shortness of breath and wheezing.   Cardiovascular: Negative.   Gastrointestinal:  Negative for abdominal pain.  Genitourinary: Negative.   Musculoskeletal:  Positive for back pain and myalgias.  Skin:  Negative for rash.  Neurological:  Positive for headaches. Negative for tingling, loss of consciousness and weakness.  Endo/Heme/Allergies:  Negative for polydipsia.     Current Outpatient Medications:    Ascorbic Acid (VITAMIN C) 1000 MG tablet, Take 1,000 mg by mouth daily., Disp: , Rfl:    B Complex-C (B-COMPLEX WITH VITAMIN C) tablet, Take 1 tablet by mouth daily., Disp: , Rfl:    cholecalciferol (VITAMIN D3) 25 MCG (1000 UNIT) tablet, Take 1,000 Units by mouth daily., Disp: , Rfl:    nirmatrelvir/ritonavir (PAXLOVID) 20 x 150 MG & 10 x 100MG  TABS, Take 3 tablets by mouth 2 (two) times daily for 5 days. (Take nirmatrelvir 150 mg two tablets twice daily for 5 days and ritonavir 100 mg one tablet twice daily for 5 days) Patient GFR is 85, Disp: 30 tablet, Rfl: 0   vitamin B-12 (CYANOCOBALAMIN) 1000 MCG  tablet, Take 1,000 mcg by mouth daily., Disp: , Rfl:    vitamin E 100 UNIT capsule, Take 100 Units by mouth daily., Disp: , Rfl:    Objective:     BP 118/70   Pulse 86   Temp (!) 100.5 F (38.1 C) (Oral)   Ht 5\' 5"  (1.651 m)   Wt 136 lb 6.4 oz (61.9 kg)   LMP 01/23/2023   SpO2 98%   BMI 22.70 kg/m    Physical Exam Constitutional:      General: She is not in acute distress.    Appearance: Normal appearance. She is not ill-appearing, toxic-appearing or diaphoretic.  HENT:     Head: Normocephalic and atraumatic.     Right Ear: Tympanic membrane, ear canal and external ear normal.     Left Ear: Tympanic membrane, ear canal and external ear normal.  Eyes:     General: No scleral icterus.       Right eye: No discharge.        Left eye: No discharge.     Extraocular Movements: Extraocular movements intact.     Conjunctiva/sclera: Conjunctivae normal.     Pupils: Pupils are equal, round, and reactive to light.  Cardiovascular:     Rate and Rhythm: Normal rate and regular rhythm.  Pulmonary:     Effort: Pulmonary effort is normal. No respiratory distress.     Breath sounds: Normal breath sounds. No wheezing, rhonchi or rales.  Abdominal:  General: Bowel sounds are normal.  Musculoskeletal:     Cervical back: No rigidity or tenderness.  Lymphadenopathy:     Cervical: No cervical adenopathy.  Skin:    General: Skin is warm and dry.  Neurological:     Mental Status: She is alert and oriented to person, place, and time.  Psychiatric:        Mood and Affect: Mood normal.        Behavior: Behavior normal.      Results for orders placed or performed in visit on 12/11/23  POCT Influenza A/B  Result Value Ref Range   Influenza A, POC Negative Negative   Influenza B, POC Negative Negative  POC COVID-19  Result Value Ref Range   SARS Coronavirus 2 Ag Positive (A) Negative      The 10-year ASCVD risk score (Arnett DK, et al., 2019) is: 1%    Assessment & Plan:    Flu-like symptoms -     POCT Influenza A/B -     POC COVID-19 BinaxNow  COVID -     nirmatrelvir/ritonavir; Take 3 tablets by mouth 2 (two) times daily for 5 days. (Take nirmatrelvir 150 mg two tablets twice daily for 5 days and ritonavir 100 mg one tablet twice daily for 5 days) Patient GFR is 85  Dispense: 30 tablet; Refill: 0    Return in about 1 week (around 12/18/2023), or if symptoms worsen or fail to improve.    Mliss Sax, MD

## 2023-12-12 ENCOUNTER — Ambulatory Visit: Payer: Self-pay | Admitting: Family Medicine

## 2023-12-12 NOTE — Telephone Encounter (Signed)
  Chief Complaint: vomiting, diarrhea x3, nausea Symptoms: vomiting, diarrhea x3 Frequency: intermittent2 Disposition: [] ED /[] Urgent Care (no appt availability in office) / [] Appointment(In office/virtual)/ []  [facility]/ [x] Home Care/ [] Refused Recommended Disposition /[] [facility]/ []  Follow-up with PCP Additional Notes: Patient called in stating she was seen in office yesterday and diagnosed with Covid. Dr. Doreene Burke prescribed her Paxlovid 300mg . First dose yesterday was okay, but a few hours after dosage this morning she had 1 episode of vomiting and 3 episodes of diarrhea. Patient is feeling very nauseous and has headache. Unsure if this is covid related or medication side effect. Calling to find out if she should continue Paxlovid. Advised patient in the meantime if she feels much worse, she needs to be seen at Urgent Care to prevent serious dehydration or furthering symptoms. Patient would like call back on if she should continue Paxlovid or treat symptoms with OTC medication.    Copied from CRM [id number]. Topic: Clinical - Red Word Triage >> Dec 12, 2023  1:06 PM Gurney Maxin H wrote: Red Word that prompted transfer to Nurse Triage: Patient states she was in yesterday and diagnosed with COVID, started Paxlovid last night and this morning woke up vomiting and nauseous. Also has a headache, first time taking Paxlovid Reason for Disposition  [1] Caller has NON-URGENT medicine question about med that PCP prescribed AND [2] triager unable to answer question  Answer Assessment - Initial Assessment Questions 1. NAME of MEDICINE: "What medicine(s) are you calling about?"     Paxlovid 300mg  pack 2. QUESTION: "What is your question?" (e.g., double dose of medicine, side effect)     Should I keep taking the medication because I am having nausea, vomiting, nauseous, headache 3. PRESCRIBER: "Who prescribed the medicine?" Reason: if prescribed by specialist, call should be referred to  that group.     Dr. Doreene Burke 4. SYMPTOMS: "Do you have any symptoms?" If Yes, ask: "What symptoms are you having?"  "How bad are the symptoms (e.g., mild, moderate, severe)     Nauseous, vomiting x1, diarrhea x3, dizzy  Protocols used: Medication Question Call-A-AH

## 2023-12-12 NOTE — Telephone Encounter (Signed)
Patient reports having vomiting and diarrhea after taking the paxlovid.   Should she continue to take this? Please review and advise. Thanks. Dm/cma

## 2023-12-12 NOTE — Telephone Encounter (Signed)
Called patient and advised of recommendations of okay to stop the Paxlovid.  No further questions. Dm/cma

## 2023-12-18 DIAGNOSIS — G4733 Obstructive sleep apnea (adult) (pediatric): Secondary | ICD-10-CM | POA: Diagnosis not present

## 2024-01-18 DIAGNOSIS — G4733 Obstructive sleep apnea (adult) (pediatric): Secondary | ICD-10-CM | POA: Diagnosis not present

## 2024-01-23 ENCOUNTER — Ambulatory Visit: Payer: BC Managed Care – PPO | Admitting: Family Medicine

## 2024-01-23 ENCOUNTER — Encounter: Payer: Self-pay | Admitting: Family Medicine

## 2024-01-23 VITALS — BP 102/60 | HR 79 | Temp 98.7°F | Ht 65.0 in | Wt 134.0 lb

## 2024-01-23 DIAGNOSIS — Z1152 Encounter for screening for COVID-19: Secondary | ICD-10-CM

## 2024-01-23 DIAGNOSIS — R6889 Other general symptoms and signs: Secondary | ICD-10-CM | POA: Diagnosis not present

## 2024-01-23 DIAGNOSIS — J069 Acute upper respiratory infection, unspecified: Secondary | ICD-10-CM | POA: Diagnosis not present

## 2024-01-23 DIAGNOSIS — Z20828 Contact with and (suspected) exposure to other viral communicable diseases: Secondary | ICD-10-CM

## 2024-01-23 DIAGNOSIS — H6992 Unspecified Eustachian tube disorder, left ear: Secondary | ICD-10-CM

## 2024-01-23 LAB — POCT INFLUENZA A/B
Influenza A, POC: NEGATIVE
Influenza B, POC: NEGATIVE

## 2024-01-23 LAB — POC COVID19 BINAXNOW: SARS Coronavirus 2 Ag: NEGATIVE

## 2024-01-23 MED ORDER — PSEUDOEPHEDRINE HCL 60 MG PO TABS
60.0000 mg | ORAL_TABLET | ORAL | 0 refills | Status: AC | PRN
Start: 2024-01-23 — End: ?

## 2024-01-23 MED ORDER — OSELTAMIVIR PHOSPHATE 75 MG PO CAPS
75.0000 mg | ORAL_CAPSULE | Freq: Two times a day (BID) | ORAL | 0 refills | Status: AC
Start: 2024-01-23 — End: 2024-01-28

## 2024-01-23 NOTE — Progress Notes (Signed)
 Established Patient Office Visit   Subjective:  Patient ID: Kayla Bowman, female    DOB: [birth year]  Age: 53 y.o. MRN: [id number]  Chief Complaint  Patient presents with   Nasal Congestion    Nasal congestion, runny nose Left ear fullness x 1 day. Pt was exposed to flu last week.     HPI Encounter Diagnoses  Name Primary?   Flu-like symptoms Yes   Encounter for screening for COVID-19    Viral upper respiratory tract infection    Dysfunction of left eustachian tube    Exposure to the flu    1 day history of nasal congestion with watery rhinorrhea, postnasal drip and malaise.  Denies headache sore throat or bodyaches.  No cough, wheezing or difficulty breathing.  Left ear has been congested.  Husband had the flu last week   Review of Systems  Constitutional:  Positive for weight loss. Negative for chills and fever.  HENT:  Positive for congestion. Negative for sore throat.   Eyes:  Negative for blurred vision, discharge and redness.  Respiratory: Negative.  Negative for cough and wheezing.   Cardiovascular: Negative.   Gastrointestinal:  Negative for abdominal pain.  Genitourinary: Negative.   Musculoskeletal: Negative.  Negative for joint pain and myalgias.  Skin:  Negative for rash.  Neurological:  Negative for tingling, loss of consciousness, weakness and headaches.  Endo/Heme/Allergies:  Negative for polydipsia.     Current Outpatient Medications:    Ascorbic Acid (VITAMIN C) 1000 MG tablet, Take 1,000 mg by mouth daily., Disp: , Rfl:    B Complex-C (B-COMPLEX WITH VITAMIN C) tablet, Take 1 tablet by mouth daily., Disp: , Rfl:    cholecalciferol (VITAMIN D3) 25 MCG (1000 UNIT) tablet, Take 1,000 Units by mouth daily., Disp: , Rfl:    oseltamivir  (TAMIFLU ) 75 MG capsule, Take 1 capsule (75 mg total) by mouth 2 (two) times daily for 5 days., Disp: 10 capsule, Rfl: 0   pseudoephedrine  (SUDAFED) 60 MG tablet, Take 1 tablet (60 mg total) by mouth every 4 (four) hours as  needed for congestion., Disp: 30 tablet, Rfl: 0   vitamin B-12 (CYANOCOBALAMIN ) 1000 MCG tablet, Take 1,000 mcg by mouth daily., Disp: , Rfl:    vitamin E 100 UNIT capsule, Take 100 Units by mouth daily., Disp: , Rfl:    Objective:     BP 102/60   Pulse 79   Temp 98.7 F (37.1 C)   Ht 5' 5 (1.651 m)   Wt 134 lb (60.8 kg)   SpO2 99%   BMI 22.30 kg/m    Physical Exam Constitutional:      General: She is not in acute distress.    Appearance: Normal appearance. She is not ill-appearing, toxic-appearing or diaphoretic.  HENT:     Head: Normocephalic and atraumatic.     Right Ear: Ear canal and external ear normal. No middle ear effusion. Tympanic membrane is retracted. Tympanic membrane is not erythematous.     Left Ear: Ear canal and external ear normal.  No middle ear effusion. Tympanic membrane is retracted. Tympanic membrane is not erythematous.     Mouth/Throat:     Mouth: Mucous membranes are moist.     Pharynx: Oropharynx is clear. No oropharyngeal exudate or posterior oropharyngeal erythema.  Eyes:     General: No scleral icterus.       Right eye: No discharge.        Left eye: No discharge.     Extraocular Movements: Extraocular  movements intact.     Conjunctiva/sclera: Conjunctivae normal.     Pupils: Pupils are equal, round, and reactive to light.  Cardiovascular:     Rate and Rhythm: Normal rate and regular rhythm.  Pulmonary:     Effort: Pulmonary effort is normal. No respiratory distress.     Breath sounds: Normal breath sounds.  Abdominal:     General: Bowel sounds are normal.     Tenderness: There is no abdominal tenderness. There is no guarding.  Musculoskeletal:     Cervical back: No rigidity or tenderness.  Skin:    General: Skin is warm and dry.  Neurological:     Mental Status: She is alert and oriented to person, place, and time.  Psychiatric:        Mood and Affect: Mood normal.        Behavior: Behavior normal.      Results for orders  placed or performed in visit on 01/23/24  POC COVID-19 BinaxNow  Result Value Ref Range   SARS Coronavirus 2 Ag Negative Negative  POCT Influenza A/B  Result Value Ref Range   Influenza A, POC Negative Negative   Influenza B, POC Negative Negative      The 10-year ASCVD risk score (Arnett DK, et al., 2019) is: 0.8%    Assessment & Plan:   Flu-like symptoms -     POCT Influenza A/B  Encounter for screening for COVID-19 -     POC COVID-19 BinaxNow  Viral upper respiratory tract infection  Dysfunction of left eustachian tube -     Pseudoephedrine  HCl; Take 1 tablet (60 mg total) by mouth every 4 (four) hours as needed for congestion.  Dispense: 30 tablet; Refill: 0  Exposure to the flu -     Oseltamivir  Phosphate; Take 1 capsule (75 mg total) by mouth 2 (two) times daily for 5 days.  Dispense: 10 capsule; Refill: 0    Return in about 1 week (around 01/30/2024), or if symptoms worsen or fail to improve.  Sudafed for ETD and nasal congestion.  Will start nasal steroid.  Tamiflu  for prophylaxis.  Elsie Sim Lent, MD

## 2024-01-26 DIAGNOSIS — Z01419 Encounter for gynecological examination (general) (routine) without abnormal findings: Secondary | ICD-10-CM | POA: Diagnosis not present

## 2024-01-26 DIAGNOSIS — N912 Amenorrhea, unspecified: Secondary | ICD-10-CM | POA: Diagnosis not present

## 2024-01-26 DIAGNOSIS — Z6822 Body mass index (BMI) 22.0-22.9, adult: Secondary | ICD-10-CM | POA: Diagnosis not present

## 2024-01-27 ENCOUNTER — Other Ambulatory Visit: Payer: Self-pay | Admitting: Obstetrics and Gynecology

## 2024-01-27 DIAGNOSIS — R928 Other abnormal and inconclusive findings on diagnostic imaging of breast: Secondary | ICD-10-CM

## 2024-01-27 DIAGNOSIS — N6489 Other specified disorders of breast: Secondary | ICD-10-CM

## 2024-02-09 ENCOUNTER — Ambulatory Visit: Payer: BC Managed Care – PPO

## 2024-02-09 ENCOUNTER — Ambulatory Visit
Admission: RE | Admit: 2024-02-09 | Discharge: 2024-02-09 | Disposition: A | Discharge status: Home / Self Care | Payer: BC Managed Care – PPO | Source: Ambulatory Visit | Attending: Obstetrics and Gynecology | Admitting: Obstetrics and Gynecology

## 2024-02-09 DIAGNOSIS — R928 Other abnormal and inconclusive findings on diagnostic imaging of breast: Secondary | ICD-10-CM

## 2024-02-15 DIAGNOSIS — G4733 Obstructive sleep apnea (adult) (pediatric): Secondary | ICD-10-CM | POA: Diagnosis not present

## 2024-02-16 DIAGNOSIS — R6882 Decreased libido: Secondary | ICD-10-CM | POA: Diagnosis not present

## 2024-02-16 DIAGNOSIS — Z131 Encounter for screening for diabetes mellitus: Secondary | ICD-10-CM | POA: Diagnosis not present

## 2024-02-16 DIAGNOSIS — E538 Deficiency of other specified B group vitamins: Secondary | ICD-10-CM | POA: Diagnosis not present

## 2024-02-16 DIAGNOSIS — E559 Vitamin D deficiency, unspecified: Secondary | ICD-10-CM | POA: Diagnosis not present

## 2024-02-16 DIAGNOSIS — Z1321 Encounter for screening for nutritional disorder: Secondary | ICD-10-CM | POA: Diagnosis not present

## 2024-02-16 DIAGNOSIS — E039 Hypothyroidism, unspecified: Secondary | ICD-10-CM | POA: Diagnosis not present

## 2024-02-16 DIAGNOSIS — N951 Menopausal and female climacteric states: Secondary | ICD-10-CM | POA: Diagnosis not present

## 2024-02-16 DIAGNOSIS — R5383 Other fatigue: Secondary | ICD-10-CM | POA: Diagnosis not present

## 2024-02-16 DIAGNOSIS — E611 Iron deficiency: Secondary | ICD-10-CM | POA: Diagnosis not present

## 2024-02-16 DIAGNOSIS — G4700 Insomnia, unspecified: Secondary | ICD-10-CM | POA: Diagnosis not present

## 2024-03-04 DIAGNOSIS — L814 Other melanin hyperpigmentation: Secondary | ICD-10-CM | POA: Diagnosis not present

## 2024-03-04 DIAGNOSIS — D225 Melanocytic nevi of trunk: Secondary | ICD-10-CM | POA: Diagnosis not present

## 2024-03-04 DIAGNOSIS — L710 Perioral dermatitis: Secondary | ICD-10-CM | POA: Diagnosis not present

## 2024-03-04 DIAGNOSIS — L821 Other seborrheic keratosis: Secondary | ICD-10-CM | POA: Diagnosis not present

## 2024-03-15 ENCOUNTER — Ambulatory Visit: Payer: BC Managed Care – PPO | Admitting: Adult Health

## 2024-03-15 ENCOUNTER — Encounter: Payer: Self-pay | Admitting: Adult Health

## 2024-03-15 VITALS — BP 100/70 | HR 73 | Ht 65.0 in | Wt 138.8 lb

## 2024-03-15 DIAGNOSIS — G4733 Obstructive sleep apnea (adult) (pediatric): Secondary | ICD-10-CM

## 2024-03-15 NOTE — Patient Instructions (Signed)
 CPAP At bedtime, wear all night long for at least 6hrs  Work on healthy sleep regimen Do not drive if sleepy Saline nasal spray Twice daily   Saline nasal gel At bedtime   Follow-up in 1 year and  As needed

## 2024-03-15 NOTE — Assessment & Plan Note (Signed)
 Has excellent control on CPAP.  Encouraged on daily usage.  She has perceived benefit.  Plan  Patient Instructions  CPAP At bedtime, wear all night long for at least 6hrs  Work on healthy sleep regimen Do not drive if sleepy Saline nasal spray Twice daily   Saline nasal gel At bedtime   Follow-up in 1 year and  As needed

## 2024-03-15 NOTE — Progress Notes (Signed)
 @Patient  ID: Kayla Bowman, female    DOB: July 15, 1971, 53 y.o.   MRN: [id number]  Chief Complaint  Patient presents with   Follow-up    Referring provider: Mliss Sax  HPI: 53 year old female seen in consult June 2024 to establish for sleep apnea   TEST/EVENTS :  Home sleep Study 05/30/2023 mild sleep apnea with AHI at 9.1/hr, SpO2 85%   03/15/2024 Follow up: OSA  Patient presents for a 26-month follow-up visit for sleep apnea.  Patient is on CPAP at bedtime.  She uses nasal pillows.  Patient states that he does feel that CPAP is benefiting.  She does not feel as tired.  Her main problem is that she wakes up after 2 to 3 hours of sleep and then has trouble falling back to sleep so she has trouble putting the mask back on.  But overall does feel that CPAP is working.  She had previously tried a oral appliance but it caused TMJ.  CPAP download shows excellent compliance with daily average usage around 3 hours.  Patient is on auto CPAP 5 to 15 cm H2O.  AHI 0.7/hour daily average pressure at 8 cm H2O.    Allergies  Allergen Reactions   Iodine Itching    Immunization History  Administered Date(s) Administered   Tdap 02/01/2022   Unspecified SARS-COV-2 Vaccination 03/20/2020, 04/18/2020    No past medical history on file.  Tobacco History: Social History   Tobacco Use  Smoking Status Never  Smokeless Tobacco Never   Counseling given: Not Answered   Outpatient Medications Prior to Visit  Medication Sig Dispense Refill   Ascorbic Acid (VITAMIN C) 1000 MG tablet Take 1,000 mg by mouth daily.     B Complex-C (B-COMPLEX WITH VITAMIN C) tablet Take 1 tablet by mouth daily.     cholecalciferol (VITAMIN D3) 25 MCG (1000 UNIT) tablet Take 1,000 Units by mouth daily.     vitamin B-12 (CYANOCOBALAMIN) 1000 MCG tablet Take 1,000 mcg by mouth daily.     vitamin E 100 UNIT capsule Take 100 Units by mouth daily.     pseudoephedrine (SUDAFED) 60 MG tablet Take 1 tablet (60 mg  total) by mouth every 4 (four) hours as needed for congestion. 30 tablet 0   No facility-administered medications prior to visit.     Review of Systems:   Constitutional:   No  weight loss, night sweats,  Fevers, chills, fatigue, or  lassitude.  HEENT:   No headaches,  Difficulty swallowing,  Tooth/dental problems, or  Sore throat,                No sneezing, itching, ear ache, nasal congestion, post nasal drip,   CV:  No chest pain,  Orthopnea, PND, swelling in lower extremities, anasarca, dizziness, palpitations, syncope.   GI  No heartburn, indigestion, abdominal pain, nausea, vomiting, diarrhea, change in bowel habits, loss of appetite, bloody stools.   Resp: No shortness of breath with exertion or at rest.  No excess mucus, no productive cough,  No non-productive cough,  No coughing up of blood.  No change in color of mucus.  No wheezing.  No chest wall deformity  Skin: no rash or lesions.  GU: no dysuria, change in color of urine, no urgency or frequency.  No flank pain, no hematuria   MS:  No joint pain or swelling.  No decreased range of motion.  No back pain.    Physical Exam  BP 100/70 (BP Location: Left Arm,  Patient Position: Sitting, Cuff Size: Normal)   Pulse 73   Ht 5\' 5"  (1.651 m)   Wt 138 lb 12.8 oz (63 kg)   SpO2 100%   BMI 23.10 kg/m   GEN: A/Ox3; pleasant , NAD, well nourished    HEENT:  [place]/AT,  EACs-clear, TMs-wnl, NOSE-clear, THROAT-clear, no lesions, no postnasal drip or exudate noted.   NECK:  Supple w/ fair ROM; no JVD; normal carotid impulses w/o bruits; no thyromegaly or nodules palpated; no lymphadenopathy.    RESP  Clear  P & A; w/o, wheezes/ rales/ or rhonchi. no accessory muscle use, no dullness to percussion  CARD:  RRR, no m/r/g, no peripheral edema, pulses intact, no cyanosis or clubbing.  GI:   Soft & nt; nml bowel sounds; no organomegaly or masses detected.   Musco: Warm bil, no deformities or joint swelling noted.   Neuro: alert,  no focal deficits noted.    Skin: Warm, no lesions or rashes    Lab Results:  CBC  No results found for: "BNP"  ProBNP No results found for: "PROBNP"  Imaging: No results found.  Administration History     None           No data to display          No results found for: "NITRICOXIDE"      Assessment & Plan:   OSA (obstructive sleep apnea) Has excellent control on CPAP.  Encouraged on daily usage.  She has perceived benefit.  Plan  Patient Instructions  CPAP At bedtime, wear all night long for at least 6hrs  Work on healthy sleep regimen Do not drive if sleepy Saline nasal spray Twice daily   Saline nasal gel At bedtime   Follow-up in 1 year and  As needed         Rubye Oaks, NP 03/15/2024

## 2024-03-24 DIAGNOSIS — N951 Menopausal and female climacteric states: Secondary | ICD-10-CM | POA: Diagnosis not present

## 2024-03-24 DIAGNOSIS — R5383 Other fatigue: Secondary | ICD-10-CM | POA: Diagnosis not present

## 2024-03-24 DIAGNOSIS — E039 Hypothyroidism, unspecified: Secondary | ICD-10-CM | POA: Diagnosis not present

## 2024-03-24 DIAGNOSIS — R6882 Decreased libido: Secondary | ICD-10-CM | POA: Diagnosis not present

## 2024-04-16 DIAGNOSIS — G4733 Obstructive sleep apnea (adult) (pediatric): Secondary | ICD-10-CM | POA: Diagnosis not present

## 2024-05-17 DIAGNOSIS — G4733 Obstructive sleep apnea (adult) (pediatric): Secondary | ICD-10-CM | POA: Diagnosis not present

## 2024-05-20 DIAGNOSIS — G4733 Obstructive sleep apnea (adult) (pediatric): Secondary | ICD-10-CM | POA: Diagnosis not present

## 2024-06-14 DIAGNOSIS — R5383 Other fatigue: Secondary | ICD-10-CM | POA: Diagnosis not present

## 2024-06-14 DIAGNOSIS — N951 Menopausal and female climacteric states: Secondary | ICD-10-CM | POA: Diagnosis not present

## 2024-06-14 DIAGNOSIS — Z1321 Encounter for screening for nutritional disorder: Secondary | ICD-10-CM | POA: Diagnosis not present

## 2024-06-14 DIAGNOSIS — E039 Hypothyroidism, unspecified: Secondary | ICD-10-CM | POA: Diagnosis not present

## 2024-06-14 DIAGNOSIS — E611 Iron deficiency: Secondary | ICD-10-CM | POA: Diagnosis not present

## 2024-06-16 DIAGNOSIS — G4733 Obstructive sleep apnea (adult) (pediatric): Secondary | ICD-10-CM | POA: Diagnosis not present

## 2024-06-19 DIAGNOSIS — G4733 Obstructive sleep apnea (adult) (pediatric): Secondary | ICD-10-CM | POA: Diagnosis not present

## 2024-07-17 DIAGNOSIS — G4733 Obstructive sleep apnea (adult) (pediatric): Secondary | ICD-10-CM | POA: Diagnosis not present

## 2024-07-20 DIAGNOSIS — G4733 Obstructive sleep apnea (adult) (pediatric): Secondary | ICD-10-CM | POA: Diagnosis not present

## 2024-08-17 DIAGNOSIS — G4733 Obstructive sleep apnea (adult) (pediatric): Secondary | ICD-10-CM | POA: Diagnosis not present

## 2024-08-17 DIAGNOSIS — N951 Menopausal and female climacteric states: Secondary | ICD-10-CM | POA: Diagnosis not present

## 2024-08-17 DIAGNOSIS — R5383 Other fatigue: Secondary | ICD-10-CM | POA: Diagnosis not present

## 2024-08-17 DIAGNOSIS — E039 Hypothyroidism, unspecified: Secondary | ICD-10-CM | POA: Diagnosis not present

## 2024-08-17 DIAGNOSIS — R6882 Decreased libido: Secondary | ICD-10-CM | POA: Diagnosis not present

## 2024-11-04 DIAGNOSIS — R5383 Other fatigue: Secondary | ICD-10-CM | POA: Diagnosis not present

## 2024-11-04 DIAGNOSIS — E611 Iron deficiency: Secondary | ICD-10-CM | POA: Diagnosis not present

## 2024-11-04 DIAGNOSIS — E559 Vitamin D deficiency, unspecified: Secondary | ICD-10-CM | POA: Diagnosis not present

## 2024-11-04 DIAGNOSIS — E039 Hypothyroidism, unspecified: Secondary | ICD-10-CM | POA: Diagnosis not present

## 2024-11-04 DIAGNOSIS — N951 Menopausal and female climacteric states: Secondary | ICD-10-CM | POA: Diagnosis not present

## 2024-11-18 DIAGNOSIS — E039 Hypothyroidism, unspecified: Secondary | ICD-10-CM | POA: Diagnosis not present

## 2024-11-18 DIAGNOSIS — R5383 Other fatigue: Secondary | ICD-10-CM | POA: Diagnosis not present

## 2024-11-18 DIAGNOSIS — N951 Menopausal and female climacteric states: Secondary | ICD-10-CM | POA: Diagnosis not present

## 2024-11-18 DIAGNOSIS — R6882 Decreased libido: Secondary | ICD-10-CM | POA: Diagnosis not present

## 2025-03-15 ENCOUNTER — Ambulatory Visit: Admitting: Adult Health
